# Patient Record
Sex: Male | Born: 1985 | Race: White | Hispanic: No | Marital: Married | Smoking: Former smoker
Health system: Southern US, Community
[De-identification: ages and names within clinical notes are randomized; demographics above are authoritative.]

## PROBLEM LIST (undated history)

## (undated) DIAGNOSIS — F319 Bipolar disorder, unspecified: Secondary | ICD-10-CM

---
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
## 2007-08-30 ENCOUNTER — Emergency Department: Payer: Self-pay | Admitting: Emergency Medicine

## 2007-09-07 ENCOUNTER — Emergency Department: Payer: Self-pay | Admitting: Emergency Medicine

## 2008-01-28 HISTORY — PX: SHOULDER SURGERY: SHX246

## 2008-07-28 ENCOUNTER — Emergency Department: Payer: Self-pay | Admitting: Emergency Medicine

## 2009-03-25 ENCOUNTER — Emergency Department (HOSPITAL_COMMUNITY): Admission: EM | Admit: 2009-03-25 | Discharge: 2009-03-25 | Payer: Self-pay | Admitting: Emergency Medicine

## 2009-03-26 ENCOUNTER — Emergency Department (HOSPITAL_COMMUNITY): Admission: EM | Admit: 2009-03-26 | Discharge: 2009-03-26 | Payer: Self-pay | Admitting: Emergency Medicine

## 2009-05-23 ENCOUNTER — Emergency Department: Payer: Self-pay | Admitting: Emergency Medicine

## 2010-03-11 ENCOUNTER — Emergency Department: Payer: Self-pay | Admitting: Emergency Medicine

## 2011-08-01 ENCOUNTER — Emergency Department: Payer: Self-pay | Admitting: Emergency Medicine

## 2011-08-11 ENCOUNTER — Ambulatory Visit: Payer: Self-pay | Admitting: General Practice

## 2012-03-21 ENCOUNTER — Emergency Department: Payer: Self-pay | Admitting: Emergency Medicine

## 2012-03-21 LAB — URINALYSIS, COMPLETE
Blood: NEGATIVE
Glucose,UR: NEGATIVE mg/dL (ref 0–75)
Leukocyte Esterase: NEGATIVE
Nitrite: NEGATIVE
Protein: NEGATIVE
WBC UR: 1 /HPF (ref 0–5)

## 2012-04-07 ENCOUNTER — Emergency Department: Payer: Self-pay | Admitting: Emergency Medicine

## 2012-12-15 ENCOUNTER — Emergency Department: Payer: Self-pay | Admitting: Emergency Medicine

## 2012-12-15 LAB — URINALYSIS, COMPLETE
Ph: 6 (ref 4.5–8.0)
RBC,UR: 5 /HPF (ref 0–5)
Squamous Epithelial: NONE SEEN

## 2012-12-15 LAB — BASIC METABOLIC PANEL
BUN: 9 mg/dL (ref 7–18)
Calcium, Total: 9.2 mg/dL (ref 8.5–10.1)
Co2: 31 mmol/L (ref 21–32)
Glucose: 96 mg/dL (ref 65–99)
Osmolality: 280 (ref 275–301)

## 2012-12-15 LAB — CBC
HCT: 45.6 % (ref 40.0–52.0)
Platelet: 202 10*3/uL (ref 150–440)

## 2013-04-27 ENCOUNTER — Emergency Department: Payer: Self-pay | Admitting: Emergency Medicine

## 2013-10-27 HISTORY — PX: OTHER SURGICAL HISTORY: SHX169

## 2013-12-24 ENCOUNTER — Emergency Department (HOSPITAL_COMMUNITY): Payer: Self-pay

## 2013-12-24 ENCOUNTER — Encounter (HOSPITAL_COMMUNITY): Payer: Self-pay | Admitting: Emergency Medicine

## 2013-12-24 ENCOUNTER — Emergency Department (HOSPITAL_COMMUNITY)
Admission: EM | Admit: 2013-12-24 | Discharge: 2013-12-24 | Disposition: A | Discharge status: Home / Self Care | Payer: Self-pay | Attending: Emergency Medicine | Admitting: Emergency Medicine

## 2013-12-24 DIAGNOSIS — Y92410 Unspecified street and highway as the place of occurrence of the external cause: Secondary | ICD-10-CM | POA: Insufficient documentation

## 2013-12-24 DIAGNOSIS — R42 Dizziness and giddiness: Secondary | ICD-10-CM | POA: Insufficient documentation

## 2013-12-24 DIAGNOSIS — S0990XA Unspecified injury of head, initial encounter: Secondary | ICD-10-CM | POA: Insufficient documentation

## 2013-12-24 DIAGNOSIS — Y9389 Activity, other specified: Secondary | ICD-10-CM | POA: Insufficient documentation

## 2013-12-24 DIAGNOSIS — S6391XA Sprain of unspecified part of right wrist and hand, initial encounter: Secondary | ICD-10-CM

## 2013-12-24 DIAGNOSIS — S6390XA Sprain of unspecified part of unspecified wrist and hand, initial encounter: Secondary | ICD-10-CM | POA: Insufficient documentation

## 2013-12-24 DIAGNOSIS — S199XXA Unspecified injury of neck, initial encounter: Secondary | ICD-10-CM

## 2013-12-24 DIAGNOSIS — S0993XA Unspecified injury of face, initial encounter: Secondary | ICD-10-CM | POA: Insufficient documentation

## 2013-12-24 MED ORDER — OXYCODONE-ACETAMINOPHEN 5-325 MG PO TABS: 1.0000 | ORAL_TABLET | ORAL | Status: DC | PRN

## 2013-12-24 MED ORDER — IBUPROFEN 600 MG PO TABS: 600.0000 mg | ORAL_TABLET | Freq: Four times a day (QID) | ORAL | Status: DC | PRN

## 2013-12-24 MED ORDER — OXYCODONE-ACETAMINOPHEN 5-325 MG PO TABS
1.0000 | ORAL_TABLET | Freq: Once | ORAL | Status: AC
  Administered 2013-12-24: 1 via ORAL
  Filled 2013-12-24: qty 1
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
## 2013-12-24 NOTE — Discharge Instructions (Signed)
Ibuprofen for pain. Percocet for severe pain. Follow up with hand specialist for further evaluation of your hand injury. Keep it elevated at home. Ice.   Sprain A sprain is a tear in one of the strong, fibrous tissues that connect your bones (ligaments). The severity of the sprain depends on how much of the ligament is torn. The tear can be either partial or complete. CAUSES  Often, sprains are a result of a fall or an injury. The force of the impact causes the fibers of your ligament to stretch beyond their normal length. This excess tension causes the fibers of your ligament to tear. SYMPTOMS  You may have some loss of motion or increased pain within your normal range of motion. Other symptoms include:  Bruising.  Tenderness.  Swelling. DIAGNOSIS  In order to diagnose a sprain, your caregiver will physically examine you to determine how torn the ligament is. Your caregiver may also suggest an X-ray exam to make sure no bones are broken. TREATMENT  If your ligament is only partially torn, treatment usually involves keeping the injured area in a fixed position (immobilization) for a short period. To do this, your caregiver will apply a bandage, cast, or splint to keep the area from moving until it heals. For a partially torn ligament, the healing process usually takes 2 to 3 weeks. If your ligament is completely torn, you may need surgery to reconnect the ligament to the bone or to reconstruct the ligament. After surgery, a cast or splint may be applied and will need to stay on for 4 to 6 weeks while your ligament heals. HOME CARE INSTRUCTIONS  Keep the injured area elevated to decrease swelling.  To ease pain and swelling, apply ice to your joint twice a day, for 2 to 3 days.  Put ice in a plastic bag.  Place a towel between your skin and the bag.  Leave the ice on for 15 minutes.  Only take over-the-counter or prescription medicine for pain as directed by your caregiver.  Do not  leave the injured area unprotected until pain and stiffness go away (usually 3 to 4 weeks).  Do not allow your cast or splint to get wet. Cover your cast or splint with a plastic bag when you shower or bathe. Do not swim.  Your caregiver may suggest exercises for you to do during your recovery to prevent or limit permanent stiffness. SEEK IMMEDIATE MEDICAL CARE IF:  Your cast or splint becomes damaged.  Your pain becomes worse. MAKE SURE YOU:  Understand these instructions.  Will watch your condition.  Will get help right away if you are not doing well or get worse. Document Released: 05/12/2000 Document Revised: 08/07/2011 Document Reviewed: 05/27/2011 [facility] Patient Information 2015 [place], [place]. This information is not intended to replace advice given to you by your health care provider. Make sure you discuss any questions you have with your health care provider.

## 2013-12-24 NOTE — Progress Notes (Signed)
Orthopedic Tech Progress Note Patient Details:  Gordy LevanKevin Sallie Jan 22, 1986 [id number]  Ortho Devices Type of Ortho Device: Ace wrap;Thumb spica splint Splint Material: Plaster Ortho Device/Splint Location: RUE Ortho Device/Splint Interventions: Ordered;Application   Jennye MoccasinHughes, Kona Lover Craig 12/24/2013, 7:48 PM
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
## 2013-12-25 NOTE — ED Provider Notes (Signed)
Medical screening examination/treatment/procedure(s) were performed by non-physician practitioner and as supervising physician I was immediately available for consultation/collaboration.  Donnika Kucher L Dare Spillman, MD 12/25/13 2340 
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
## 2014-10-31 ENCOUNTER — Emergency Department: Payer: Self-pay

## 2014-10-31 ENCOUNTER — Encounter: Payer: Self-pay | Admitting: Emergency Medicine

## 2014-10-31 ENCOUNTER — Emergency Department
Admission: EM | Admit: 2014-10-31 | Discharge: 2014-10-31 | Disposition: A | Discharge status: Home / Self Care | Payer: Self-pay | Attending: Emergency Medicine | Admitting: Emergency Medicine

## 2014-10-31 DIAGNOSIS — S20211A Contusion of right front wall of thorax, initial encounter: Secondary | ICD-10-CM | POA: Insufficient documentation

## 2014-10-31 DIAGNOSIS — Y9389 Activity, other specified: Secondary | ICD-10-CM | POA: Insufficient documentation

## 2014-10-31 DIAGNOSIS — Y9289 Other specified places as the place of occurrence of the external cause: Secondary | ICD-10-CM | POA: Insufficient documentation

## 2014-10-31 DIAGNOSIS — S4992XA Unspecified injury of left shoulder and upper arm, initial encounter: Secondary | ICD-10-CM | POA: Insufficient documentation

## 2014-10-31 DIAGNOSIS — Y998 Other external cause status: Secondary | ICD-10-CM | POA: Insufficient documentation

## 2014-10-31 DIAGNOSIS — Z87891 Personal history of nicotine dependence: Secondary | ICD-10-CM | POA: Insufficient documentation

## 2014-10-31 DIAGNOSIS — S46911A Strain of unspecified muscle, fascia and tendon at shoulder and upper arm level, right arm, initial encounter: Secondary | ICD-10-CM | POA: Insufficient documentation

## 2014-10-31 MED ORDER — KETOROLAC TROMETHAMINE 60 MG/2ML IM SOLN
INTRAMUSCULAR | Status: AC
  Administered 2014-10-31: 60 mg via INTRAMUSCULAR
  Filled 2014-10-31: qty 2

## 2014-10-31 MED ORDER — ORPHENADRINE CITRATE 30 MG/ML IJ SOLN
INTRAMUSCULAR | Status: AC
  Administered 2014-10-31: 60 mg via INTRAMUSCULAR
  Filled 2014-10-31: qty 2

## 2014-10-31 MED ORDER — KETOROLAC TROMETHAMINE 60 MG/2ML IM SOLN
60.0000 mg | Freq: Once | INTRAMUSCULAR | Status: AC
  Administered 2014-10-31: 60 mg via INTRAMUSCULAR

## 2014-10-31 MED ORDER — ORPHENADRINE CITRATE 30 MG/ML IJ SOLN
60.0000 mg | INTRAMUSCULAR | Status: AC
  Administered 2014-10-31: 60 mg via INTRAMUSCULAR

## 2014-10-31 MED ORDER — KETOROLAC TROMETHAMINE 10 MG PO TABS: 10.0000 mg | ORAL_TABLET | Freq: Three times a day (TID) | ORAL | Status: DC

## 2014-10-31 MED ORDER — CYCLOBENZAPRINE HCL 5 MG PO TABS: 5.0000 mg | ORAL_TABLET | Freq: Three times a day (TID) | ORAL | Status: AC | PRN

## 2014-10-31 MED ORDER — HYDROCODONE-ACETAMINOPHEN 5-325 MG PO TABS: 1.0000 | ORAL_TABLET | ORAL | Status: AC | PRN
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
## 2015-02-12 ENCOUNTER — Ambulatory Visit
Admission: RE | Admit: 2015-02-12 | Discharge: 2015-02-12 | Disposition: A | Discharge status: Home / Self Care | Payer: Medicaid Other | Source: Ambulatory Visit | Attending: Internal Medicine | Admitting: Internal Medicine

## 2015-02-12 ENCOUNTER — Emergency Department
Admission: EM | Admit: 2015-02-12 | Discharge: 2015-02-12 | Disposition: A | Discharge status: Home / Self Care | Payer: Medicaid Other | Attending: Emergency Medicine | Admitting: Emergency Medicine

## 2015-02-12 ENCOUNTER — Other Ambulatory Visit: Payer: Self-pay | Admitting: Internal Medicine

## 2015-02-12 DIAGNOSIS — Y998 Other external cause status: Secondary | ICD-10-CM | POA: Insufficient documentation

## 2015-02-12 DIAGNOSIS — Y9389 Activity, other specified: Secondary | ICD-10-CM | POA: Diagnosis not present

## 2015-02-12 DIAGNOSIS — Z87891 Personal history of nicotine dependence: Secondary | ICD-10-CM | POA: Insufficient documentation

## 2015-02-12 DIAGNOSIS — M25512 Pain in left shoulder: Secondary | ICD-10-CM | POA: Insufficient documentation

## 2015-02-12 DIAGNOSIS — M24412 Recurrent dislocation, left shoulder: Secondary | ICD-10-CM

## 2015-02-12 DIAGNOSIS — S4992XA Unspecified injury of left shoulder and upper arm, initial encounter: Secondary | ICD-10-CM | POA: Diagnosis present

## 2015-02-12 DIAGNOSIS — Y92410 Unspecified street and highway as the place of occurrence of the external cause: Secondary | ICD-10-CM | POA: Insufficient documentation

## 2015-02-12 DIAGNOSIS — Z87828 Personal history of other (healed) physical injury and trauma: Secondary | ICD-10-CM | POA: Diagnosis not present

## 2015-02-12 MED ORDER — MELOXICAM 15 MG PO TABS
15.0000 mg | ORAL_TABLET | Freq: Every day | ORAL | Status: AC
Start: 2015-02-12 — End: ?

## 2015-02-12 MED ORDER — OXYCODONE-ACETAMINOPHEN 5-325 MG PO TABS: 1.0000 | ORAL_TABLET | Freq: Four times a day (QID) | ORAL | Status: AC | PRN
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
## 2015-02-12 NOTE — Discharge Instructions (Signed)
Shoulder Dislocation  Your shoulder is made up of three bones: the collar bone (clavicle); the shoulder blade (scapula), which includes the socket (glenoid cavity); and the upper arm bone (humerus). Your shoulder joint is the place where these bones meet. Strong, fibrous tissues hold these bones together (ligaments). Muscles and strong, fibrous tissues that connect the muscles to these bones (tendons) allow your arm to move through this joint. The range of motion of your shoulder joint is more extensive than most of your other joints, and the glenoid cavity is very shallow. That is the reason that your shoulder joint is one of the most unstable joints in your body. It is far more prone to dislocation than your other joints. Shoulder dislocation is when your humerus is forced out of your shoulder joint.  CAUSES  Shoulder dislocation is caused by a forceful impact on your shoulder. This impact usually is from an injury, such as a sports injury or a fall.  SYMPTOMS  Symptoms of shoulder dislocation include:  · Deformity of your shoulder.  · Intense pain.  · Inability to move your shoulder joint.  · Numbness, weakness, or tingling around your shoulder joint (your neck or down your arm).  · Bruising or swelling around your shoulder.  DIAGNOSIS  In order to diagnose a dislocated shoulder, your caregiver will perform a physical exam. Your caregiver also may have an X-ray exam done to see if you have any broken bones. Magnetic resonance imaging (MRI) is a procedure that sometimes is done to help your caregiver see any damage to the soft tissues around your shoulder, particularly your rotator cuff tendons. Additionally, your caregiver also may have electromyography done to measure the electrical discharges produced in your muscles if you have signs or symptoms of nerve damage.  TREATMENT  A shoulder dislocation is treated by placing the humerus back in the joint (reduction). Your caregiver does this either manually (closed  reduction), by moving your humerus back into the joint through manipulation, or through surgery (open reduction). When your humerus is back in place, severe pain should improve almost immediately.  You also may need to have surgery if you have a weak shoulder joint or ligaments, and you have recurring shoulder dislocations, despite rehabilitation. In rare cases, surgery is necessary if your nerves or blood vessels are damaged during the dislocation.  After your reduction, your arm will be placed in a shoulder immobilizer or sling to keep it from moving. Your caregiver will have you wear your shoulder immobilizer or sling for 3 days to 3 weeks, depending on how serious your dislocation is. When your shoulder immobilizer or sling is removed, your caregiver may prescribe physical therapy to help improve the range of motion in your shoulder joint.  HOME CARE INSTRUCTIONS   The following measures can help to reduce pain and speed up the healing process:  · Rest your injured joint. Do not move it. Avoid activities similar to the one that caused your injury.  · Apply ice to your injured joint for the first day or two after your reduction or as directed by your caregiver. Applying ice helps to reduce inflammation and pain.  ¨ Put ice in a plastic bag.  ¨ Place a towel between your skin and the bag.  ¨ Leave the ice on for 15-20 minutes at a time, every 2 hours while you are awake.  · Exercise your hand by squeezing a soft ball. This helps to eliminate stiffness and swelling in your hand and   wrist.  · Take over-the-counter or prescription medicine for pain or discomfort as told by your caregiver.  SEEK IMMEDIATE MEDICAL CARE IF:   · Your shoulder immobilizer or sling becomes damaged.  · Your pain becomes worse rather than better.  · You lose feeling in your arm or hand, or they become white and cold.  MAKE SURE YOU:   · Understand these instructions.  · Will watch your condition.  · Will get help right away if you are not  doing well or get worse.  Document Released: 02/07/2001 Document Revised: 09/29/2013 Document Reviewed: 03/05/2011  [employer]® Patient Information ©2015 [employer]. This information is not intended to replace advice given to you by your health care provider. Make sure you discuss any questions you have with your health care provider.
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
## 2015-02-24 ENCOUNTER — Emergency Department (HOSPITAL_COMMUNITY): Payer: Medicaid Other

## 2015-02-24 ENCOUNTER — Encounter (HOSPITAL_COMMUNITY): Payer: Self-pay | Admitting: Emergency Medicine

## 2015-02-24 ENCOUNTER — Emergency Department (HOSPITAL_COMMUNITY)
Admission: EM | Admit: 2015-02-24 | Discharge: 2015-02-24 | Disposition: A | Discharge status: Home / Self Care | Payer: Medicaid Other | Attending: Emergency Medicine | Admitting: Emergency Medicine

## 2015-02-24 DIAGNOSIS — Z791 Long term (current) use of non-steroidal anti-inflammatories (NSAID): Secondary | ICD-10-CM | POA: Insufficient documentation

## 2015-02-24 DIAGNOSIS — S4991XA Unspecified injury of right shoulder and upper arm, initial encounter: Secondary | ICD-10-CM | POA: Diagnosis present

## 2015-02-24 DIAGNOSIS — S40212A Abrasion of left shoulder, initial encounter: Secondary | ICD-10-CM | POA: Insufficient documentation

## 2015-02-24 DIAGNOSIS — Z8659 Personal history of other mental and behavioral disorders: Secondary | ICD-10-CM | POA: Insufficient documentation

## 2015-02-24 DIAGNOSIS — Y92410 Unspecified street and highway as the place of occurrence of the external cause: Secondary | ICD-10-CM | POA: Insufficient documentation

## 2015-02-24 DIAGNOSIS — Y998 Other external cause status: Secondary | ICD-10-CM | POA: Insufficient documentation

## 2015-02-24 DIAGNOSIS — S8992XA Unspecified injury of left lower leg, initial encounter: Secondary | ICD-10-CM | POA: Insufficient documentation

## 2015-02-24 DIAGNOSIS — Y9389 Activity, other specified: Secondary | ICD-10-CM | POA: Diagnosis not present

## 2015-02-24 DIAGNOSIS — M79601 Pain in right arm: Secondary | ICD-10-CM

## 2015-02-24 DIAGNOSIS — Z87891 Personal history of nicotine dependence: Secondary | ICD-10-CM | POA: Insufficient documentation

## 2015-02-24 HISTORY — DX: Bipolar disorder, unspecified: F31.9

## 2015-02-24 MED ORDER — SODIUM CHLORIDE 0.9 % IV BOLUS (SEPSIS)
1000.0000 mL | Freq: Once | INTRAVENOUS | Status: AC
  Administered 2015-02-24: 1000 mL via INTRAVENOUS

## 2015-02-24 MED ORDER — KETOROLAC TROMETHAMINE 30 MG/ML IJ SOLN
30.0000 mg | Freq: Once | INTRAMUSCULAR | Status: AC
  Administered 2015-02-24: 30 mg via INTRAVENOUS
  Filled 2015-02-24: qty 1

## 2015-02-24 MED ORDER — ONDANSETRON HCL 4 MG/2ML IJ SOLN
4.0000 mg | Freq: Once | INTRAMUSCULAR | Status: AC
  Administered 2015-02-24: 4 mg via INTRAVENOUS
  Filled 2015-02-24: qty 2

## 2015-02-24 MED ORDER — HYDROMORPHONE HCL 1 MG/ML IJ SOLN
1.0000 mg | Freq: Once | INTRAMUSCULAR | Status: AC
  Administered 2015-02-24: 1 mg via INTRAVENOUS
  Filled 2015-02-24: qty 1
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
## 2015-02-24 NOTE — Discharge Instructions (Signed)

## 2015-04-30 ENCOUNTER — Emergency Department
Admission: EM | Admit: 2015-04-30 | Discharge: 2015-04-30 | Disposition: A | Discharge status: Home / Self Care | Payer: Medicaid Other | Attending: Emergency Medicine | Admitting: Emergency Medicine

## 2015-04-30 ENCOUNTER — Encounter: Payer: Self-pay | Admitting: Medical Oncology

## 2015-04-30 DIAGNOSIS — L03211 Cellulitis of face: Secondary | ICD-10-CM | POA: Diagnosis not present

## 2015-04-30 DIAGNOSIS — L089 Local infection of the skin and subcutaneous tissue, unspecified: Secondary | ICD-10-CM | POA: Diagnosis present

## 2015-04-30 DIAGNOSIS — Z87891 Personal history of nicotine dependence: Secondary | ICD-10-CM | POA: Diagnosis not present

## 2015-04-30 DIAGNOSIS — Z791 Long term (current) use of non-steroidal anti-inflammatories (NSAID): Secondary | ICD-10-CM | POA: Insufficient documentation

## 2015-04-30 MED ORDER — SULFAMETHOXAZOLE-TRIMETHOPRIM 800-160 MG PO TABS: 1.0000 | ORAL_TABLET | Freq: Two times a day (BID) | ORAL | Status: AC

## 2015-04-30 MED ORDER — CEFTRIAXONE SODIUM 250 MG IJ SOLR
250.0000 mg | Freq: Once | INTRAMUSCULAR | Status: AC
  Administered 2015-04-30: 250 mg via INTRAMUSCULAR
  Filled 2015-04-30: qty 250

## 2015-04-30 NOTE — ED Provider Notes (Signed)
[facility] Emergency Department Provider Note  ____________________________________________  Time seen: Approximately 3:32 PM  I have reviewed the triage vital signs and the nursing notes.   HISTORY  Chief Complaint Wound Infection    HPI Mario Drake is a 29 y.o. male presents to the emergency department for a complaint of "infectedpiercings/implants." He states that he had these placed 4-5 weeks ago is now experiencing increased redness, swelling, and minimal purulent drainage. He reports that the pain has increased over the 24 hours. He endorses a headache with mild photophobia to right eye. Denies any visual acuity changes. He denies any fevers or chills. Denies any neck pain. Denies any chest pain or shortness of breath. Pain is described as sharp/burning, constant, unrelieved by over-the-counter medications.   Past Medical History  Diagnosis Date  . Bipolar 1 disorder (HCC)     There are no active problems to display for this patient.   Past Surgical History  Procedure Laterality Date  . Shoulder surgery Left 08/2006  . Shoulder surgery Left 01/2008  . Thumb surgery Right 10/2013    Current Outpatient Rx  Name  Route  Sig  Dispense  Refill  . cyclobenzaprine (FLEXERIL) 5 MG tablet   Oral   Take 1 tablet (5 mg total) by mouth every 8 (eight) hours as needed for muscle spasms. Patient not taking: Reported on 02/24/2015   12 tablet   0   . HYDROcodone-acetaminophen (NORCO) 5-325 MG per tablet   Oral   Take 1 tablet by mouth every 4 (four) hours as needed for moderate pain. Patient not taking: Reported on 02/24/2015   6 tablet   0   . ketorolac (TORADOL) 10 MG tablet   Oral   Take 1 tablet (10 mg total) by mouth every 8 (eight) hours.   15 tablet   0   . meloxicam (MOBIC) 15 MG tablet   Oral   Take 1 tablet (15 mg total) by mouth daily.   30 tablet   0   . oxyCODONE-acetaminophen (ROXICET) 5-325 MG per tablet   Oral   Take 1  tablet by mouth every 6 (six) hours as needed. Patient not taking: Reported on 02/24/2015   20 tablet   0   . sulfamethoxazole-trimethoprim (BACTRIM DS,SEPTRA DS) 800-160 MG tablet   Oral   Take 1 tablet by mouth 2 (two) times daily.   14 tablet   0     Allergies Valium  No family history on file.  Social History Social History  Substance Use Topics  . Smoking status: Former Smoker    Quit date: 11/24/2014  . Smokeless tobacco: Current User  . Alcohol Use: 0.6 oz/week    1 Standard drinks or equivalent per week    Review of Systems Constitutional: No fever/chills Eyes: No visual changes. ENT: No sore throat. Cardiovascular: Denies chest pain. Respiratory: Denies shortness of breath. Gastrointestinal: No abdominal pain.  No nausea, no vomiting.  No diarrhea.  No constipation. Genitourinary: Negative for dysuria. Musculoskeletal: Negative for back pain. Skin: Negative for rash. Endorses infection around facial piercings/implant Neurological: Negative for headaches, focal weakness or numbness.  10-point ROS otherwise negative.  ____________________________________________   PHYSICAL EXAM:  VITAL SIGNS: ED Triage Vitals  Enc Vitals Group     BP 04/30/15 1435 120/81 mmHg     Pulse Rate 04/30/15 1435 62     Resp 04/30/15 1435 18     Temp 04/30/15 1435 97.8 F (36.6 C)  Temp Source 04/30/15 1435 Oral     SpO2 04/30/15 1435 97 %     Weight 04/30/15 1435 220 lb (99.791 kg)     Height --      Head Cir --      Peak Flow --      Pain Score 04/30/15 1436 7     Pain Loc --      Pain Edu? --      Excl. in GC? --     Constitutional: Alert and oriented. Well appearing and in no acute distress. Eyes: Conjunctivae are normal. PERRL. EOMI. Head: Atraumatic. Nose: No congestion/rhinnorhea. Mouth/Throat: Mucous membranes are moist.  Oropharynx non-erythematous. Neck: No stridor.   Hematological/Lymphatic/Immunilogical: No cervical lymphadenopathy. Cardiovascular:  Normal rate, regular rhythm. Grossly normal heart sounds.  Good peripheral circulation. Respiratory: Normal respiratory effort.  No retractions. Lungs CTAB. Gastrointestinal: Soft and nontender. No distention. No abdominal bruits. No CVA tenderness. Musculoskeletal: No lower extremity tenderness nor edema.  No joint effusions. Neurologic:  Normal speech and language. No gross focal neurologic deficits are appreciated. No gait instability. Skin:  Skin is warm, dry and intact. No rash noted. For micro piercings/implant are appreciated to the lateral aspect of right orbit. Around each piercing/and plan there is erythema and edema noted. 1 implant has minor purulent crusting around. No fluctuance to the area. There is mild firmness to the area. Psychiatric: Mood and affect are normal. Speech and behavior are normal.  ____________________________________________   LABS (all labs ordered are listed, but only abnormal results are displayed)  Labs Reviewed - No data to display ____________________________________________  EKG   ____________________________________________  RADIOLOGY   ____________________________________________   PROCEDURES  Procedure(s) performed: None  Critical Care performed: No  ____________________________________________   INITIAL IMPRESSION / ASSESSMENT AND PLAN / ED COURSE  Pertinent labs & imaging results that were available during my care of the patient were reviewed by me and considered in my medical decision making (see chart for details).  Patient's history, symptoms, physical exam are consistent with infected nursing/implants to the right lateral orbital rim. There is no underlying fluctuance at this time. Patient received 250 mg IM Rocephin here in the emergency department and was placed on Bactrim. I advised patient to follow-up with ENT provider for follow-up for possible removal. Patient verbalizes understanding of diagnosis and treatment plan and  verbalizes compliance of same. ____________________________________________   FINAL CLINICAL IMPRESSION(S) / ED DIAGNOSES  Final diagnoses:  Cellulitis of face      Racheal PatchesJonathan D Moya Duan, PA-C 04/30/15 1551  Emily FilbertJonathan E Williams, MD 05/03/15 [phone]

## 2015-04-30 NOTE — ED Notes (Signed)
Pt c/o severe pain to right arm after injection. Warm compress applied. No redness or swelling noted.  johnathan pa at bedside to assess.

## 2015-04-30 NOTE — Discharge Instructions (Signed)

## 2015-04-30 NOTE — ED Notes (Signed)
Pt reports that he has 4 dermal piercings to rt side of face that have become painful and making his vision blurry in the rt eye.

## 2015-04-30 NOTE — ED Notes (Signed)
Pt much calmer and arm pain improving. Remains with heat to arm.

## 2015-06-16 ENCOUNTER — Encounter: Payer: Self-pay | Admitting: *Deleted

## 2015-06-23 NOTE — Discharge Instructions (Signed)

## 2015-06-24 ENCOUNTER — Ambulatory Visit: Payer: Medicaid Other | Admitting: Anesthesiology

## 2015-06-24 ENCOUNTER — Ambulatory Visit
Admission: RE | Admit: 2015-06-24 | Discharge: 2015-06-24 | Disposition: A | Discharge status: Home / Self Care | Payer: Medicaid Other | Source: Ambulatory Visit | Attending: Otolaryngology | Admitting: Otolaryngology

## 2015-06-24 ENCOUNTER — Encounter
Admission: RE | Disposition: A | Discharge status: Home / Self Care | Payer: Medicaid Other | Source: Ambulatory Visit | Attending: Otolaryngology

## 2015-06-24 DIAGNOSIS — Y939 Activity, unspecified: Secondary | ICD-10-CM | POA: Insufficient documentation

## 2015-06-24 DIAGNOSIS — Z87891 Personal history of nicotine dependence: Secondary | ICD-10-CM | POA: Insufficient documentation

## 2015-06-24 DIAGNOSIS — X58XXXA Exposure to other specified factors, initial encounter: Secondary | ICD-10-CM | POA: Diagnosis not present

## 2015-06-24 DIAGNOSIS — L723 Sebaceous cyst: Secondary | ICD-10-CM | POA: Diagnosis not present

## 2015-06-24 DIAGNOSIS — S0085XA Superficial foreign body of other part of head, initial encounter: Secondary | ICD-10-CM | POA: Diagnosis present

## 2015-06-24 HISTORY — PX: MASS EXCISION: SHX2000

## 2015-06-24 SURGERY — EXCISION MASS
Anesthesia: Monitor Anesthesia Care | Site: Face | Laterality: Right | Wound class: Clean

## 2015-06-24 MED ORDER — BACITRACIN 500 UNIT/GM EX OINT
TOPICAL_OINTMENT | CUTANEOUS | Status: DC | PRN
  Administered 2015-06-24: 1 via TOPICAL

## 2015-06-24 MED ORDER — LIDOCAINE-EPINEPHRINE 1 %-1:100000 IJ SOLN
INTRAMUSCULAR | Status: DC | PRN
  Administered 2015-06-24: 3 mL

## 2015-06-24 MED ORDER — FENTANYL CITRATE (PF) 100 MCG/2ML IJ SOLN: 25.0000 ug | INTRAMUSCULAR | Status: DC | PRN

## 2015-06-24 MED ORDER — LACTATED RINGERS IV SOLN
INTRAVENOUS | Status: DC
  Administered 2015-06-24: 11:00:00 via INTRAVENOUS

## 2015-06-24 MED ORDER — FENTANYL CITRATE (PF) 100 MCG/2ML IJ SOLN
INTRAMUSCULAR | Status: DC | PRN
  Administered 2015-06-24: 100 ug via INTRAVENOUS

## 2015-06-24 MED ORDER — ONDANSETRON HCL 4 MG/2ML IJ SOLN: 4.0000 mg | Freq: Once | INTRAMUSCULAR | Status: DC | PRN

## 2015-06-24 MED ORDER — PROPOFOL 500 MG/50ML IV EMUL
INTRAVENOUS | Status: DC | PRN
  Administered 2015-06-24: 100 ug/kg/min via INTRAVENOUS

## 2015-06-24 MED ORDER — MIDAZOLAM HCL 2 MG/2ML IJ SOLN
INTRAMUSCULAR | Status: DC | PRN
  Administered 2015-06-24: 2 mg via INTRAVENOUS

## 2015-06-24 MED ORDER — LIDOCAINE HCL (CARDIAC) 20 MG/ML IV SOLN
INTRAVENOUS | Status: DC | PRN
  Administered 2015-06-24: 50 mg via INTRAVENOUS

## 2015-06-24 MED ORDER — OXYCODONE HCL 5 MG PO TABS
5.0000 mg | ORAL_TABLET | Freq: Once | ORAL | Status: AC
  Administered 2015-06-24: 5 mg via ORAL

## 2015-06-24 MED ORDER — ONDANSETRON HCL 4 MG/2ML IJ SOLN
INTRAMUSCULAR | Status: DC | PRN
  Administered 2015-06-24: 4 mg via INTRAVENOUS

## 2015-06-24 SURGICAL SUPPLY — 24 items
BANDAGE ADH SHEER 1  50/CT (GAUZE/BANDAGES/DRESSINGS) ×6 IMPLANT
BLADE SURG SZ11 CARB STEEL (BLADE) ×3 IMPLANT
CNTNR SPEC 2.5X3XGRAD LEK (MISCELLANEOUS) ×1
CONT SPEC 4OZ STER OR WHT (MISCELLANEOUS) ×2
CONTAINER SPEC 2.5X3XGRAD LEK (MISCELLANEOUS) ×1 IMPLANT
CORD BIP STRL DISP 12FT (MISCELLANEOUS) ×3 IMPLANT
DRAPE HEAD BAR (DRAPES) ×3 IMPLANT
DRESSING TELFA 4X3 1S ST N-ADH (GAUZE/BANDAGES/DRESSINGS) IMPLANT
GAUZE SPONGE 4X4 12PLY STRL (GAUZE/BANDAGES/DRESSINGS) IMPLANT
GLOVE PI ULTRA LF STRL 7.5 (GLOVE) ×2 IMPLANT
GLOVE PI ULTRA NON LATEX 7.5 (GLOVE) ×4
KIT ROOM TURNOVER OR (KITS) ×3 IMPLANT
LIQUID BAND (GAUZE/BANDAGES/DRESSINGS) IMPLANT
NEEDLE HYPO 25GX1X1/2 BEV (NEEDLE) ×3 IMPLANT
NS IRRIG 500ML POUR BTL (IV SOLUTION) ×3 IMPLANT
PACK DRAPE NASAL/ENT (PACKS) ×3 IMPLANT
SOL PREP PVP 2OZ (MISCELLANEOUS) ×3
SOLUTION PREP PVP 2OZ (MISCELLANEOUS) ×1 IMPLANT
STRAP BODY AND KNEE 60X3 (MISCELLANEOUS) ×6 IMPLANT
SUT PROLENE 5 0 P 3 (SUTURE) ×3 IMPLANT
SUT PROLENE 6 0 P 1 18 (SUTURE) ×3 IMPLANT
SUT VIC AB 4-0 RB1 27 (SUTURE)
SUT VIC AB 4-0 RB1 27X BRD (SUTURE) IMPLANT
SYRINGE 10CC LL (SYRINGE) ×3 IMPLANT

## 2015-06-24 NOTE — Transfer of Care (Signed)
Immediate Anesthesia Transfer of Care Note  Patient: Mario Drake  Procedure(s) Performed: Procedure(s): EXCISION OF FOREIGN BODIES FROM RIGHT TEMPLE AREA (Right)  Patient Location: PACU  Anesthesia Type: MAC  Level of Consciousness: awake, alert  and patient cooperative  Airway and Oxygen Therapy: Patient Spontanous Breathing and Patient connected to supplemental oxygen  Post-op Assessment: Post-op Vital signs reviewed, Patient's Cardiovascular Status Stable, Respiratory Function Stable, Patent Airway and No signs of Nausea or vomiting  Post-op Vital Signs: Reviewed and stable  Complications: No apparent anesthesia complications

## 2015-06-24 NOTE — Anesthesia Preprocedure Evaluation (Signed)
Anesthesia Evaluation  Patient identified by MRN, date of birth, ID band  Reviewed: Allergy & Precautions, H&P , NPO status , Patient's Chart, lab work & pertinent test results  Airway Mallampati: II  TM Distance: >3 FB Neck ROM: full    Dental no notable dental hx.    Pulmonary former smoker,    Pulmonary exam normal        Cardiovascular  Rhythm:regular Rate:Normal     Neuro/Psych PSYCHIATRIC DISORDERS    GI/Hepatic   Endo/Other    Renal/GU      Musculoskeletal   Abdominal   Peds  Hematology   Anesthesia Other Findings   Reproductive/Obstetrics                             Anesthesia Physical Anesthesia Plan  ASA: II  Anesthesia Plan: MAC   Post-op Pain Management:    Induction:   Airway Management Planned:   Additional Equipment:   Intra-op Plan:   Post-operative Plan:   Informed Consent: I have reviewed the patients History and Physical, chart, labs and discussed the procedure including the risks, benefits and alternatives for the proposed anesthesia with the patient or authorized representative who has indicated his/her understanding and acceptance.     Plan Discussed with: CRNA  Anesthesia Plan Comments:         Anesthesia Quick Evaluation  

## 2015-06-24 NOTE — Anesthesia Postprocedure Evaluation (Signed)
Anesthesia Post Note  [name]  Procedure(s) Performed: Procedure(s) (LRB): EXCISION OF FOREIGN BODIES FROM RIGHT TEMPLE AREA (Right)  Patient location during evaluation: PACU Anesthesia Type: MAC Level of consciousness: awake and alert and oriented Pain management: satisfactory to patient Vital Signs Assessment: post-procedure vital signs reviewed and stable Respiratory status: spontaneous breathing, nonlabored ventilation and respiratory function stable Cardiovascular status: blood pressure returned to baseline and stable Postop Assessment: Adequate PO intake and No signs of nausea or vomiting Anesthetic complications: no    Cherly Beach

## 2015-06-24 NOTE — Anesthesia Procedure Notes (Signed)
Procedure Name: MAC Performed by: Seth Friedlander Pre-anesthesia Checklist: Patient identified, Emergency Drugs available, Suction available, Timeout performed and Patient being monitored Patient Re-evaluated:Patient Re-evaluated prior to inductionOxygen Delivery Method: Nasal cannula Placement Confirmation: positive ETCO2       

## 2015-06-24 NOTE — Op Note (Signed)
06/24/2015  1:09 PM    Mario Drake  [id number]   Pre-Op Dx:  Foreign body of skin right temple area 4  Post-op Dx: Same  Proc: Removal of foreign body right temporal area 4   Surg:  [name]  Anes:  GOT  EBL:  Minimal  Comp:  None  Findings:  The patient had implanted foreign bodies into the skin of the temple area in 4 separate areas. He's had been infected in our scarred down. There appeared to be a small sebaceous cyst at the most inferior medial foreign body that was expressed when the piece of metal was removed  Procedure: The patient was given IV sedation placed in a supine position on the operating room table. His head was turned slightly to the left side. You could see the foreign bodies sticking out through the skin in a semicircular fashion from his right temple and around to his right upper cheek. Half of milliliter of Xylocaine 1% with epi 1:100,000 was used for infiltration of the skin at each foreign body. He was prepped and draped sterile fashion.  The piece of metal the could be seen was grasped and pulled out to be able to visualize where the deeper buried portion of the foreign body was. Sinal incision was created to go through skin and down subcutaneous to separate scar tissue from the foreign body. The 4 mm incision was created to get it to finally popout. Leading was controlled with direct pressure. This was repeated 3 more times for removal of each foreign body. The most inferior medial 1 had some thick white cheeselike material that came out around it that appeared to be from a sebaceous cyst, created by the foreign body.  Each wound was closed using a 6-0 Prolene with an interrupted suture to close the skin. The patient tolerated the procedure well. He was awakened and taken to the recovery room in satisfactory condition. There were no operative complications.  Dispo:   To PACU to be discharged home.  Plan:  To follow-up in the office in 5 days for  suture removal. He'll keep Band-Aid overgrown to protect the skin. He'll remain on his antibiotics and all if he has any problems prior to his return visit. The foreign bodies were given to the family to take home.  [name]  06/24/2015 1:09 PM

## 2015-06-24 NOTE — H&P (Signed)
  H&P has been reviewed and no changes necessary. To be downloaded later. 

## 2015-06-25 ENCOUNTER — Encounter: Payer: Self-pay | Admitting: Otolaryngology

## 2015-10-22 ENCOUNTER — Emergency Department
Admission: EM | Admit: 2015-10-22 | Discharge: 2015-10-22 | Disposition: A | Discharge status: Home / Self Care | Payer: Medicaid Other | Attending: Emergency Medicine | Admitting: Emergency Medicine

## 2015-10-22 ENCOUNTER — Emergency Department: Payer: Medicaid Other

## 2015-10-22 DIAGNOSIS — Y999 Unspecified external cause status: Secondary | ICD-10-CM | POA: Insufficient documentation

## 2015-10-22 DIAGNOSIS — S60221A Contusion of right hand, initial encounter: Secondary | ICD-10-CM | POA: Diagnosis not present

## 2015-10-22 DIAGNOSIS — Z87891 Personal history of nicotine dependence: Secondary | ICD-10-CM | POA: Diagnosis not present

## 2015-10-22 DIAGNOSIS — F319 Bipolar disorder, unspecified: Secondary | ICD-10-CM | POA: Diagnosis not present

## 2015-10-22 DIAGNOSIS — W208XXA Other cause of strike by thrown, projected or falling object, initial encounter: Secondary | ICD-10-CM | POA: Diagnosis not present

## 2015-10-22 DIAGNOSIS — Y9389 Activity, other specified: Secondary | ICD-10-CM | POA: Diagnosis not present

## 2015-10-22 DIAGNOSIS — M79641 Pain in right hand: Secondary | ICD-10-CM | POA: Diagnosis present

## 2015-10-22 DIAGNOSIS — Y929 Unspecified place or not applicable: Secondary | ICD-10-CM | POA: Insufficient documentation

## 2015-10-22 MED ORDER — MORPHINE SULFATE (PF) 4 MG/ML IV SOLN
4.0000 mg | Freq: Once | INTRAVENOUS | Status: AC
  Administered 2015-10-22: 4 mg via INTRAVENOUS
  Filled 2015-10-22: qty 1

## 2015-10-22 MED ORDER — ONDANSETRON HCL 4 MG/2ML IJ SOLN
4.0000 mg | Freq: Once | INTRAMUSCULAR | Status: AC
  Administered 2015-10-22: 4 mg via INTRAVENOUS
  Filled 2015-10-22: qty 2

## 2015-10-22 MED ORDER — KETOROLAC TROMETHAMINE 10 MG PO TABS: 10.0000 mg | ORAL_TABLET | Freq: Three times a day (TID) | ORAL | Status: AC | PRN

## 2015-10-22 NOTE — ED Provider Notes (Signed)
[facility] Emergency Department Provider Note  ____________________________________________  Time seen: 3:40 AM  I have reviewed the triage vital signs and the nursing notes.   HISTORY  Chief Complaint Hand Pain     HPI Mario Drake is a 30 y.o. male presents with right hand swelling and pain status post having a 40 pound weight lifting plate accidentally falling on his hand. Patient admits 10 out of 10 pain in his right hand/wrist at this time.    Past Medical History  Diagnosis Date  . Bipolar 1 disorder (HCC)     There are no active problems to display for this patient.   Past Surgical History  Procedure Laterality Date  . Shoulder surgery Left 08/2006  . Shoulder surgery Left 01/2008  . Thumb surgery Right 10/2013  . Mass excision Right 06/24/2015    Procedure: EXCISION OF FOREIGN BODIES FROM RIGHT TEMPLE AREA;  Surgeon: Vernie MurdersPaul Juengel, MD;  [facility];  Service: ENT;  Laterality: Right;    Current Outpatient Rx  Name  Route  Sig  Dispense  Refill  . cyclobenzaprine (FLEXERIL) 5 MG tablet   Oral   Take 1 tablet (5 mg total) by mouth every 8 (eight) hours as needed for muscle spasms. Patient not taking: Reported on 02/24/2015   12 tablet   0   . HYDROcodone-acetaminophen (NORCO) 5-325 MG per tablet   Oral   Take 1 tablet by mouth every 4 (four) hours as needed for moderate pain. Patient not taking: Reported on 02/24/2015   6 tablet   0   . ketorolac (TORADOL) 10 MG tablet   Oral   Take 1 tablet (10 mg total) by mouth every 8 (eight) hours. Patient not taking: Reported on 06/16/2015   15 tablet   0   . meloxicam (MOBIC) 15 MG tablet   Oral   Take 1 tablet (15 mg total) by mouth daily. Patient not taking: Reported on 06/16/2015   30 tablet   0   . oxyCODONE-acetaminophen (ROXICET) 5-325 MG per tablet   Oral   Take 1 tablet by mouth every 6 (six) hours as needed. Patient not taking: Reported on 02/24/2015   20  tablet   0   . sulfamethoxazole-trimethoprim (BACTRIM DS,SEPTRA DS) 800-160 MG tablet   Oral   Take 1 tablet by mouth 2 (two) times daily. Patient not taking: Reported on 06/16/2015   14 tablet   0     Allergies Valium  No family history on file.  Social History Social History  Substance Use Topics  . Smoking status: Former Smoker    Quit date: 11/24/2014  . Smokeless tobacco: Current User  . Alcohol Use: No    Review of Systems  Constitutional: Negative for fever. Eyes: Negative for visual changes. ENT: Negative for sore throat. Cardiovascular: Negative for chest pain. Respiratory: Negative for shortness of breath. Gastrointestinal: Negative for abdominal pain, vomiting and diarrhea. Genitourinary: Negative for dysuria. Musculoskeletal: Negative for back pain.Positive for right hand pain swelling Skin: Negative for rash. Neurological: Negative for headaches, focal weakness or numbness.   10-point ROS otherwise negative.  ____________________________________________   PHYSICAL EXAM:  VITAL SIGNS: ED Triage Vitals  Enc Vitals Group     BP 10/22/15 0325 141/77 mmHg     Pulse Rate 10/22/15 0325 94     Resp 10/22/15 0325 18     Temp 10/22/15 0325 98.2 F (36.8 C)     Temp Source 10/22/15 0325 Oral  SpO2 10/22/15 0325 98 %     Weight 10/22/15 0325 240 lb (108.863 kg)     Height 10/22/15 0325  (1.854 m)     Head Cir --      Peak Flow --      Pain Score 10/22/15 0326 10     Pain Loc --      Pain Edu? --      Excl. in GC? --      Constitutional: Alert and oriented. Apparent discomfort. Eyes: Conjunctivae are normal. PERRL. Normal extraocular movements. ENT   Head: Normocephalic and atraumatic.   Nose: No congestion/rhinnorhea.   Mouth/Throat: Mucous membranes are moist.   Neck: No stridor. Gastrointestinal: Soft and nontender. No distention. There is no CVA tenderness. Genitourinary: deferred Musculoskeletal:Swelling/contusion  noted to the dorsal aspect of the right hand  Neurologic:  Normal speech and language. No gross focal neurologic deficits are appreciated. Speech is normal.  Skin:  Skin is warm, dry and intact. No rash noted. Contusion to the dorsal aspect of the right hand    RADIOLOGY   DG Hand Complete Right (Final result) Result time: 10/22/15 04:02:45   Final result by Rad Results In Interface (10/22/15 04:02:45)   Narrative:   CLINICAL DATA: Crush injury. Right hand and wrist pain after dropping 40 pound weight on hand/wrist.  EXAM: RIGHT HAND - COMPLETE 3+ VIEW  COMPARISON: Hand and wrist radiographs 12/24/2013  FINDINGS: There is no evidence of fracture or dislocation. There is no evidence of other focal bone abnormality. Dorsal soft tissue edema about the metacarpals.  IMPRESSION: Dorsal soft tissue edema about the metacarpals. No fracture or subluxation.   Electronically Signed By: Rubye Oaks M.D. On: 10/22/2015 04:02          DG Wrist Complete Right (Final result) Result time: 10/22/15 04:04:15   Final result by Rad Results In Interface (10/22/15 04:04:15)   Narrative:   CLINICAL DATA: Right hand and wrist pain after dropping a 40 pound weight on it. Crush injury.  EXAM: RIGHT WRIST - COMPLETE 3+ VIEW  COMPARISON: Radiograph 12/24/2013  FINDINGS: There is no evidence of fracture or dislocation. There is no evidence of other focal bone abnormality. Scaphoid is intact. Dorsal soft tissue edema about the metacarpals.  IMPRESSION: No fracture or subluxation of the right wrist.   Electronically Signed By: Rubye Oaks M.D. On: 10/22/2015 04:04         INITIAL IMPRESSION / ASSESSMENT AND PLAN / ED COURSE  Pertinent labs & imaging results that were available during my care of the patient were reviewed by me and considered in my medical decision making (see chart for  details).    ____________________________________________   FINAL CLINICAL IMPRESSION(S) / ED DIAGNOSES  Final diagnoses:  Hand contusion, right, initial encounter      Darci Current, MD 10/22/15 [phone]

## 2015-10-22 NOTE — ED Notes (Signed)
Patient presents to ED after having dropped a 40 pound plate weight on his right hand. States his fingers are numb and hot. Hand with large hematoma to top of hand. Cap refill <3 secs.

## 2015-10-22 NOTE — ED Notes (Signed)
Pt given ice pack at this time.

## 2015-10-22 NOTE — Discharge Instructions (Signed)
Hand Contusion  A hand contusion is a deep bruise on your hand area. Contusions are the result of an injury that caused bleeding under the skin. The contusion may turn blue, purple, or yellow. Minor injuries will give you a painless contusion, but more severe contusions may stay painful and swollen for a few weeks.  CAUSES   A contusion is usually caused by a blow, trauma, or direct force to an area of the body.  SYMPTOMS    Swelling and redness of the injured area.   Discoloration of the injured area.   Tenderness and soreness of the injured area.   Pain.  DIAGNOSIS   The diagnosis can be made by taking a history and performing a physical exam. An X-ray, CT scan, or MRI may be needed to determine if there were any associated injuries, such as broken bones (fractures).  TREATMENT   Often, the best treatment for a hand contusion is resting, elevating, icing, and applying cold compresses to the injured area. Over-the-counter medicines may also be recommended for pain control.  HOME CARE INSTRUCTIONS    Put ice on the injured area.    Put ice in a plastic bag.    Place a towel between your skin and the bag.    Leave the ice on for 15-20 minutes, 03-04 times a day.   Only take over-the-counter or prescription medicines as directed by your caregiver. Your caregiver may recommend avoiding anti-inflammatory medicines (aspirin, ibuprofen, and naproxen) for 48 hours because these medicines may increase bruising.   If told, use an elastic wrap as directed. This can help reduce swelling. You may remove the wrap for sleeping, showering, and bathing. If your fingers become numb, cold, or blue, take the wrap off and reapply it more loosely.   Elevate your hand with pillows to reduce swelling.   Avoid overusing your hand if it is painful.  SEEK IMMEDIATE MEDICAL CARE IF:    You have increased redness, swelling, or pain in your hand.   Your swelling or pain is not relieved with medicines.   You have loss of feeling in  your hand or are unable to move your fingers.   Your hand turns cold or blue.   You have pain when you move your fingers.   Your hand becomes warm to the touch.   Your contusion does not improve in 2 days.  MAKE SURE YOU:    Understand these instructions.   Will watch your condition.   Will get help right away if you are not doing well or get worse.     This information is not intended to replace advice given to you by your health care provider. Make sure you discuss any questions you have with your health care provider.     Document Released: 11/04/2001 Document Revised: 02/07/2012 Document Reviewed: 11/06/2011  Elsevier Interactive Patient Education 2016 [employer]

## 2016-09-02 ENCOUNTER — Emergency Department: Admit: 2016-09-02

## 2016-09-02 ENCOUNTER — Inpatient Hospital Stay
Admit: 2016-09-02 | Discharge: 2016-09-02 | Disposition: A | Discharge status: Home / Self Care | Attending: Emergency Medicine

## 2016-09-02 DIAGNOSIS — J209 Acute bronchitis, unspecified: Secondary | ICD-10-CM

## 2016-09-02 MED ORDER — PROMETHAZINE-DM 6.25-15 MG/5ML PO SYRP
Freq: Four times a day (QID) | ORAL | 0 refills | Status: AC | PRN
Start: 2016-09-02 — End: 2016-09-09

## 2016-09-02 MED ORDER — PREDNISONE 20 MG PO TABS
20 MG | ORAL_TABLET | Freq: Every day | ORAL | 0 refills | Status: AC
Start: 2016-09-02 — End: 2016-09-07

## 2016-09-02 MED ORDER — AMOXICILLIN-POT CLAVULANATE 875-125 MG PO TABS
875-125 MG | ORAL_TABLET | Freq: Two times a day (BID) | ORAL | 0 refills | Status: AC
Start: 2016-09-02 — End: 2016-09-12

## 2016-09-02 NOTE — ED Provider Notes (Signed)
[facility]  eMERGENCY dEPARTMENT eNCOUnter          CHIEF COMPLAINT       Chief Complaint   Patient presents with   ??? Pharyngitis     sore throat for 3 days        Nurses Notes reviewed and I agree except as noted in the HPI.      HISTORY OF PRESENT ILLNESS    Roger Ochoa is a 31 y.o. male who presents To emergency room with complaint of sore throat for the past 3 days, occasional cough with yellow-green production, subjective clamminess and warmth was not had a absolute fever.  Patient denies any known sick contacts.  Patient has tried Chloraseptic spray without relief.  Patient is a smoker     REVIEW OF SYSTEMS     Review of Systems   All other systems reviewed and are negative.         PAST MEDICAL HISTORY    has no past medical history on file.    SURGICAL HISTORY      has a past surgical history that includes shoulder surgery and Hand surgery.    CURRENT MEDICATIONS       Previous Medications    No medications on file       ALLERGIES     has No Known Allergies.    FAMILY HISTORY     has no family status information on file.    family history is not on file.    SOCIAL HISTORY      reports that he has been smoking Cigarettes.  He has been smoking about 0.50 packs per day. He has never used smokeless tobacco.    PHYSICAL EXAM     INITIAL VITALS:  height is  (1.88 m) and weight is 231 lb 4.8 oz (104.9 kg). His oral temperature is 97.8 ??F (36.6 ??C). His blood pressure is 117/79 and his pulse is 78. His respiration is 20 and oxygen saturation is 98%.    Physical Exam   Constitutional: Patient is oriented to person, place, and time. Patient appears well-developed and well-nourished. Patient is active and cooperative.    HENT:   Head: Normocephalic and atraumatic. Head is without contusion.   Right Ear: Hearing and external ear normal. No drainage.   Left Ear: Hearing and external ear normal. No drainage.   Nose: Nose normal. No nasal deformity. No epistaxis.   Mouth/Throat: Mucous  membranes are not dry.  Noted erythema, there is no gross exudate however there are noted several off-white lesions measuring 1-2 mm along the tonsillar pillars and posterior pharynx  Eyes: EOMI. Conjunctivae, sclera, and lids are normal. Right eye exhibits no discharge. Left eye exhibits no discharge.   Neck: Full passive range of motion without pain and phonation normal.   Cardiovascular:  Normal rate, regular rhythm and intact distal pulses.   No edema.  Pulses: Right radial pulse  2+   Pulmonary/Chest: Effort normal. No tachypnea and no bradypnea.  Trace central coarseness  Abdominal: Soft. Patient without distension or tenderness  Musculoskeletal:   Negative acute trauma or deformity,  apparent full range of motion and normal strength all extremities appropriate to age.  Neurological: Patient is alert and oriented to person, place, and time. patient displays no tremor. Patient displays no seizure activity. .  Lymphatic:  Anterior cervical lymphadenopathy noted  Skin: Skin is warm and dry. Patient is not diaphoretic.  Psychiatric: Patient has a normal  mood and affect. Patient speech is normal and behavior is normal. Cognition and memory are normal.      DIFFERENTIAL DIAGNOSIS:   Pneumonia bronchitis URI strep throat viral pharyngitis     DIAGNOSTIC RESULTS           RADIOLOGY: non-plain film images(s) such as CT, Ultrasound and MRI are read by the radiologist.  XR CHEST STANDARD (2 VW)   Preliminary Result   Preliminary report only      Nonacute two-view chest.          LABS:   Labs Reviewed - No data to display    EMERGENCY DEPARTMENT COURSE:   Vitals:    Vitals:    09/02/16 1524   BP: 117/79   Pulse: 78   Resp: 20   Temp: 97.8 ??F (36.6 ??C)   TempSrc: Oral   SpO2: 98%   Weight: 231 lb 4.8 oz (104.9 kg)   Height:  (1.88 m)     Patient history physical exam taken at bedside, discussed patient's symptoms and exam findings as well as plan workup to include 2 view chest x-ray, will hold on any blood work or  swabs until imaging reviewed, acknowledged    Imaging reviewed    Discussed with patient imaging findings, x-ray to patient that as he is 3 days in the illness I would offer influenza swab and blood work or at this time I do not feel would change diagnosis, and patient declines any additional testing.  Confirmed allergies, we'll initiate patient on a five-day steroid burst and Phenergan DM for posterior sore throat symptoms as well as cough, and we'll give a SNAP prescription for Augmentin and patient's symptoms do not improve in 48 hours and/or he develops a fever.  Discussed OTC cough cold medications, importance of hydration, advised stop smoking, will refer to [facility] primary care for follow-up as needed, acknowledges    FINAL IMPRESSION      1. Acute bronchitis, unspecified organism    2. Sore throat          DISPOSITION/PLAN   Discharge    PATIENT REFERRED TO:  [facility]  [address]  [place] [address]  [phone]  Call         DISCHARGE MEDICATIONS:  New Prescriptions    AMOXICILLIN-CLAVULANATE (AUGMENTIN) 875-125 MG PER TABLET    Take 1 tablet by mouth 2 times daily for 10 days    PREDNISONE (DELTASONE) 20 MG TABLET    Take 3 tablets by mouth daily for 5 days    PROMETHAZINE-DEXTROMETHORPHAN (PROMETHAZINE-DM) 6.25-15 MG/5ML SYRUP    Take 5 mLs by mouth 4 times daily as needed for Cough           Summation      Patient Course:  Discharge    ED Medications administered this visit:  Medications - No data to display    New Prescriptions from this visit:    New Prescriptions    AMOXICILLIN-CLAVULANATE (AUGMENTIN) 875-125 MG PER TABLET    Take 1 tablet by mouth 2 times daily for 10 days    PREDNISONE (DELTASONE) 20 MG TABLET    Take 3 tablets by mouth daily for 5 days    PROMETHAZINE-DEXTROMETHORPHAN (PROMETHAZINE-DM) 6.25-15 MG/5ML SYRUP    Take 5 mLs by mouth 4 times daily as needed for Cough       Follow-up:  [facility]  [address]  [place]  [address]  [phone]  Call           Final Impression:   1. Acute bronchitis, unspecified organism    2. Sore throat               (Please note that portions of this note were completed with a voice recognition program.  Efforts were made to edit the dictations but occasionally words are mis-transcribed.)    Farrell Ours, MD            Farrell Ours, MD  09/02/16 [phone]

## 2016-09-02 NOTE — ED Triage Notes (Signed)
Pt not currently taking any medications

## 2016-10-13 ENCOUNTER — Emergency Department: Admit: 2016-10-14

## 2016-10-13 DIAGNOSIS — J209 Acute bronchitis, unspecified: Secondary | ICD-10-CM

## 2016-10-13 NOTE — ED Provider Notes (Signed)
[facility]  eMERGENCY dEPARTMENT eNCOUnter      Pt Name: Roger HoarKevin W Ochoa  MRN: [id number]  Birthdate 1986/01/24  Date of evaluation: 10/13/2016  Provider: Rolland PorterJames Landis Dowdy, MD    CHIEF COMPLAINT       Chief Complaint   Patient presents with   . Cough     patient to ED with c/o "deep cough" x 2 days     Patient is a 31 year old male presents to the emergency department complaining of cough for 2 days.  Patient states he's had a deep cough and has sinus congestion.  He denies fever but has had some chills.  He denies any abdominal pain and denies vomiting or diarrhea.  He states nothing is really making it better or worse.  He noted a few specks of blood in it tonight as well.        Nursing Notes were reviewed.    REVIEW OF SYSTEMS    (2-9 systems for level 4, 10 or more for level 5)     Review of Systems   Constitutional: Negative for chills and fever.   HENT: Positive for congestion and sinus pressure. Negative for ear pain, sore throat and trouble swallowing.    Eyes: Negative for pain and redness.   Respiratory: Positive for cough. Negative for shortness of breath.    Cardiovascular: Negative for chest pain and palpitations.   Gastrointestinal: Negative for abdominal pain, diarrhea, nausea and vomiting.   Genitourinary: Negative for dysuria and frequency.   Musculoskeletal: Negative for back pain and neck pain.   Skin: Negative for color change and rash.   Neurological: Negative for dizziness, syncope and headaches.   Psychiatric/Behavioral: Negative for hallucinations and suicidal ideas.       Except as noted above the remainder of the review of systems was reviewed and negative.       PAST MEDICAL HISTORY   History reviewed. No pertinent past medical history.      SURGICAL HISTORY       Past Surgical History:   Procedure Laterality Date   . HAND SURGERY      left thumb surgery   . SHOULDER SURGERY      left shoulder x 2         ALLERGIES     Patient has no known allergies.    FAMILY HISTORY     History  reviewed. No pertinent family history.       SOCIAL HISTORY       Social History     Social History   . Marital status: Single     Spouse name: N/A   . Number of children: N/A   . Years of education: N/A     Social History Main Topics   . Smoking status: Current Every Day Smoker     Packs/day: 0.50     Types: Cigarettes   . Smokeless tobacco: Never Used   . Alcohol use Yes   . Drug use: No   . Sexual activity: Yes     Partners: Female     Other Topics Concern   . None     Social History Narrative   . None           PHYSICAL EXAM    (up to 7 for level 4, 8 or more for level 5)     ED Triage Vitals [10/13/16 2048]   BP Temp Temp Source Pulse Resp SpO2 Height Weight   129/71 98.1 F (  36.7 C) Oral 74 20 94 % 6\' 1"  (1.854 m) 240 lb (108.9 kg)       Physical Exam     Physical    Vital signs and nursing notes were reviewed as well as the social, family, and past medical history.    Gen. appearance: Patient is alert and oriented and in no acute distress    Head: Atraumatic, normocephalic    Neck: Supple, trachea/thyroid normal    EENT: PERRLA, EOMI, conjunctiva normal.    Skin: Warm and dry with no rash    Cardiovascular: Heart RRR, no gallops or rubs, no aortic enlargement or bruits noted.    Respiratory: Lungs clear, no wheezing, no rales, normal breath sounds.    Gastrointestinal: Abdomen nontender, bowel sounds normal, no rebound/guarding/distention or mass    Musculoskeletal: No tenderness in the extremities, no back or hip pain.    Neurological: Patient is alert and oriented 3, no focal motor or sensory deficits noted,       DIAGNOSTIC RESULTS           EMERGENCY DEPARTMENT COURSE and DIFFERENTIAL DIAGNOSIS/MDM:   Vitals:    Vitals:    10/13/16 2048   BP: 129/71   Pulse: 74   Resp: 20   Temp: 98.1 F (36.7 C)   TempSrc: Oral   SpO2: 94%   Weight: 240 lb (108.9 kg)   Height: 6\' 1"  (1.854 m)                 REASSESSMENT    patient's chest x-ray was negative for pneumonia and we will treat for bronchitis and  discharged home      PROCEDURES:  Unless otherwise noted below, none     Procedures    FINAL IMPRESSION      1. Acute bronchitis, unspecified organism    2. Other acute sinusitis, recurrence not specified          DISPOSITION/PLAN   DISPOSITION Decision To Discharge 10/13/2016 09:57:19 PM      PATIENT REFERRED TO:  [facility] ED  [address] [place] [address]  [phone]    If symptoms worsen      DISCHARGE MEDICATIONS:  New Prescriptions    AZITHROMYCIN (ZITHROMAX Z-PAK) 250 MG TABLET    Take 1 z-pak as directed.    OXYMETAZOLINE (12 HOUR NASAL SPRAY) 0.05 % NASAL SPRAY    2 sprays by Nasal route 2 times daily          (Please note that portions of this note were completed with a voice recognition program.  Efforts were made to edit the dictations but occasionally words are mis-transcribed.)    Rolland Porter, MD (electronically signed)  Attending Emergency Physician            Rolland Porter, MD  10/13/16 2158

## 2016-10-13 NOTE — ED Notes (Signed)
Patient to ED with c/o "deep cough" x 2 days, c/o pain in rib area from coughing and " terrible headache"     Roger HalstedRobin L Moncia Annas, RN  10/13/16 2052

## 2016-10-13 NOTE — ED Triage Notes (Signed)
Patient is not on any meds at home

## 2016-10-14 ENCOUNTER — Inpatient Hospital Stay
Admit: 2016-10-14 | Discharge: 2016-10-14 | Disposition: A | Discharge status: Home / Self Care | Attending: Emergency Medicine

## 2016-10-14 MED ORDER — DILTIAZEM HCL 30 MG PO TABS
30 MG | Freq: Once | ORAL | Status: DC
Start: 2016-10-14 — End: 2016-10-13

## 2016-10-14 MED ORDER — OXYMETAZOLINE HCL 0.05 % NA SOLN
0.05 % | Freq: Two times a day (BID) | NASAL | 0 refills | Status: AC
Start: 2016-10-14 — End: 2016-11-12

## 2016-10-14 MED ORDER — AZITHROMYCIN 250 MG PO TABS
250 MG | PACK | ORAL | 0 refills | Status: DC
Start: 2016-10-14 — End: 2017-05-05

## 2016-11-19 ENCOUNTER — Emergency Department: Admit: 2016-11-20

## 2016-11-19 DIAGNOSIS — S93402A Sprain of unspecified ligament of left ankle, initial encounter: Secondary | ICD-10-CM

## 2016-11-19 NOTE — ED Triage Notes (Signed)
Pt states he takes no home medications.

## 2016-11-19 NOTE — ED Notes (Signed)
Writer elevates pt's foot and applies ice.     Quentin MullingKristen A Murriel Eidem, RN  11/19/16 2144

## 2016-11-19 NOTE — ED Provider Notes (Signed)
eMERGENCY dEPARTMENT eNCOUnter      CHIEF COMPLAINT    Chief Complaint   Patient presents with   . Ankle Pain     pt c/o left ankle injury 30 minutes PTA.  he states he rolled it playing basketball and the pain is shooting up his leg and his knee is sore as well.       HPI    Roger Ochoa is a 31 y.o. male who presentsto ED from Home  By private car  With complaint of left ankle pain  Onset 30 minutes ago  Intensity of symptoms mild  Location of symptoms left ankle  Patient here for evaluation of left ankle pain.  He rolled his ankle while playing basketball.  Complains of mild swelling on the higher aspect of his ankle.  Patient is able to ambulate but complains of pain with ambulation.  There are no open wound.   patient denies hitting his head or complaints of neck pain.      PAST MEDICAL HISTORY    History reviewed. No pertinent past medical history.    SURGICAL HISTORY    Past Surgical History:   Procedure Laterality Date   . HAND SURGERY      left thumb surgery   . SHOULDER SURGERY      left shoulder x 2       CURRENT MEDICATIONS    Current Outpatient Rx   Medication Sig Dispense Refill   . ibuprofen (IBU) 600 MG tablet Take 1 tablet by mouth every 6 hours as needed for Pain 120 tablet 0   . azithromycin (ZITHROMAX Z-PAK) 250 MG tablet Take 1 z-pak as directed. 1 packet 0       ALLERGIES    No Known Allergies    FAMILY HISTORY    History reviewed. No pertinent family history.    SOCIAL HISTORY    Social History     Social History   . Marital status: Single     Spouse name: N/A   . Number of children: N/A   . Years of education: N/A     Social History Main Topics   . Smoking status: Current Every Day Smoker     Packs/day: 0.25     Types: Cigarettes   . Smokeless tobacco: Never Used   . Alcohol use Yes   . Drug use: No   . Sexual activity: Yes     Partners: Female     Other Topics Concern   . None     Social History Narrative   . None       REVIEW OF SYSTEMS    Constitutional:  Denies fever, chills  Eyes:   Denies photophobia or discharge   HENT:  Denies sore throat or ear pain   Respiratory:  Denies cough or shortness of breath   Cardiovascular:  Denies chest pain, palpitations or swelling   GI:  Denies abdominal pain, nausea, vomiting, or diarrhea   Musculoskeletal:  C/o Left Ankle Pain, Denies back pain   Skin:  Denies rash   Neurologic:  Denies headache, focal weakness or sensory changes   Endocrine:  Denies polyuria or polydypsia   Lymphatic:  Denies swollen glands   Psychiatric:  Denies depression, suicidal ideation or homicidal ideation   All systems negative except as marked.     PHYSICAL EXAM    VITAL SIGNS: BP 126/79   Pulse 90   Temp 98 F (36.7 C) (Oral)   Resp 18  Wt 215 lb (97.5 kg)   SpO2 98%   BMI 28.37 kg/m    Constitutional:  Well developed, Well nourished, No acute distress, Non-toxic appearance.   HENT:  Normocephalic, Atraumatic, Bilateral external ears normal, Oropharynx moist, No oral exudates, Nose normal. Neck- Normal range of motion, No tenderness, Supple, No stridor.   Eyes:  PERRL, EOMI, Conjunctiva normal, No discharge.   Respiratory:  Normal breath sounds, No respiratory distress, No wheezing, No chest tenderness.   Cardiovascular:  Normal heart rate, Normal rhythm, No murmurs, No rubs, No gallops.   GI:  Bowel sounds normal, Soft, No tenderness, No masses, No pulsatile masses.   GU:  No CVA tenderness.   Musculoskeletal:  Left ankle mild swelling on the lateral aspect, no bony tenderness, range of motion limited secondary to pain, distal neurovascular intact.  Left knee-no tenderness, normal range of motion no effusion.  Back- No tenderness.   Integument:  Warm, Dry, No erythema, No rash.   Lymphatic:  No lymphadenopathy noted.   Neurologic:  Alert & oriented x 3, Normal motor function, Normal sensory function, No focal deficits noted.   Psychiatric:  Affect normal, Judgment normal, Mood normal.       RADIOLOGY    XR ANKLE LEFT (MIN 3 VIEWS)   Final Result   No ankle fracture  identified      LEFT FOOT AP lateral and oblique 11/19/2016      No foot fracture is evident. Joint spacing looks appropriate.         IMPRESSION: No acute process in the foot is evident         XR FOOT LEFT (MIN 3 VIEWS)   Final Result   No ankle fracture identified      LEFT FOOT AP lateral and oblique 11/19/2016      No foot fracture is evident. Joint spacing looks appropriate.         IMPRESSION: No acute process in the foot is evident             REEVALUATION   Pain control adequate.  Patient ambulating.  Updated results of the x-rays with the patient.  PROCEDURES    Labs  Labs Reviewed - No data to display          Summation      Patient Course:     ED Medications administered this visit:    Medications   ibuprofen (ADVIL;MOTRIN) tablet 800 mg (800 mg Oral Given 11/19/16 2258)       New Prescriptions from this visit:    Discharge Medication List as of 11/19/2016 10:34 PM      START taking these medications    Details   ibuprofen (IBU) 600 MG tablet Take 1 tablet by mouth every 6 hours as needed for Pain, Disp-120 tablet, R-0Print             Follow-up:  No follow-up provider specified.      Final Impression:   1. Sprain of left ankle, unspecified ligament, subsequent encounter               (Please note that portions of this note were completed with a voice recognition program.  Efforts were made to edit the dictations but occasionally words are mis-transcribed.)            Faylene MillionPrashant Blessen Kimbrough, MD  11/21/16 [phone]

## 2016-11-19 NOTE — Discharge Instructions (Signed)
Please return if worse.  Please keep Limb elevated

## 2016-11-20 ENCOUNTER — Inpatient Hospital Stay
Admit: 2016-11-20 | Discharge: 2016-11-20 | Disposition: A | Discharge status: Home / Self Care | Attending: Emergency Medicine

## 2016-11-20 MED ORDER — IBUPROFEN 200 MG PO TABS
200 MG | Freq: Once | ORAL | Status: AC
Start: 2016-11-20 — End: 2016-11-19
  Administered 2016-11-20: 03:00:00 800 mg via ORAL

## 2016-11-20 MED ORDER — IBUPROFEN 600 MG PO TABS
600 MG | ORAL_TABLET | Freq: Four times a day (QID) | ORAL | 0 refills | Status: DC | PRN
Start: 2016-11-20 — End: 2017-05-05

## 2016-11-20 MED FILL — IBU-200 200 MG PO TABS: 200 MG | ORAL | Qty: 4

## 2016-11-20 MED FILL — IBUPROFEN 200 MG PO TABS: 200 MG | ORAL | Qty: 3

## 2017-01-29 IMAGING — CR DG SHOULDER 2+V*L*
3 series · 3 of 3 positions shown · non-contrast
Comparison: 10/31/2014 dating back to 09/07/2007.

CLINICAL DATA: Frequent recurrent left shoulder dislocation after
an initial injury with approximately 7 years ago. Three week history
of left shoulder pain currently. No recent injuries. Prior left
shoulder surgery x 2.

EXAM:
LEFT SHOULDER - 2+ VIEW

[shoulder grashey]
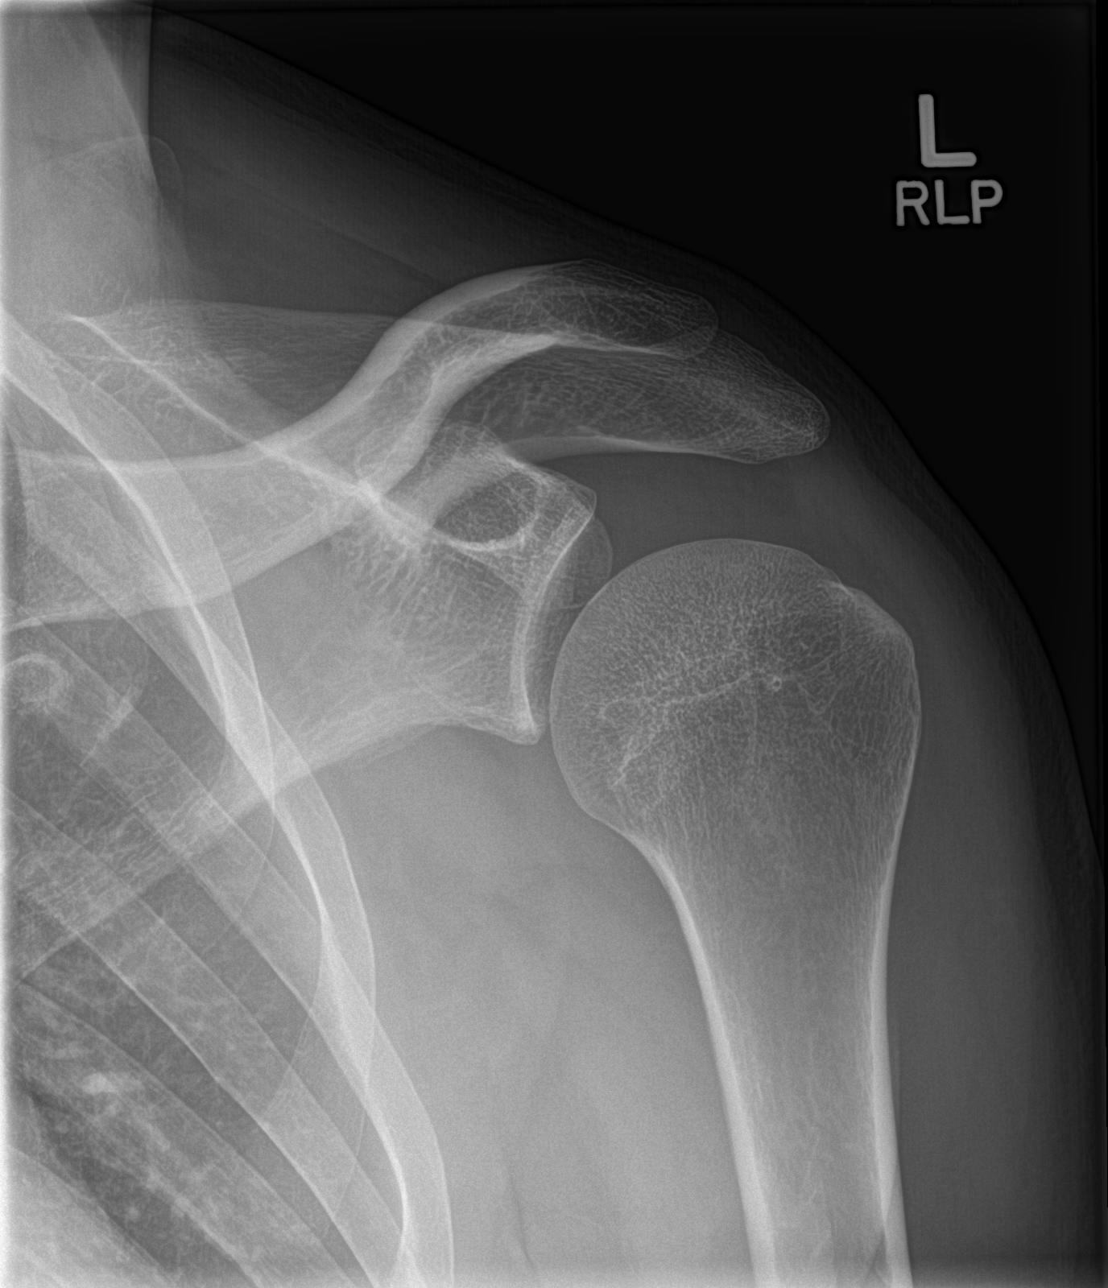

[shoulder y view]
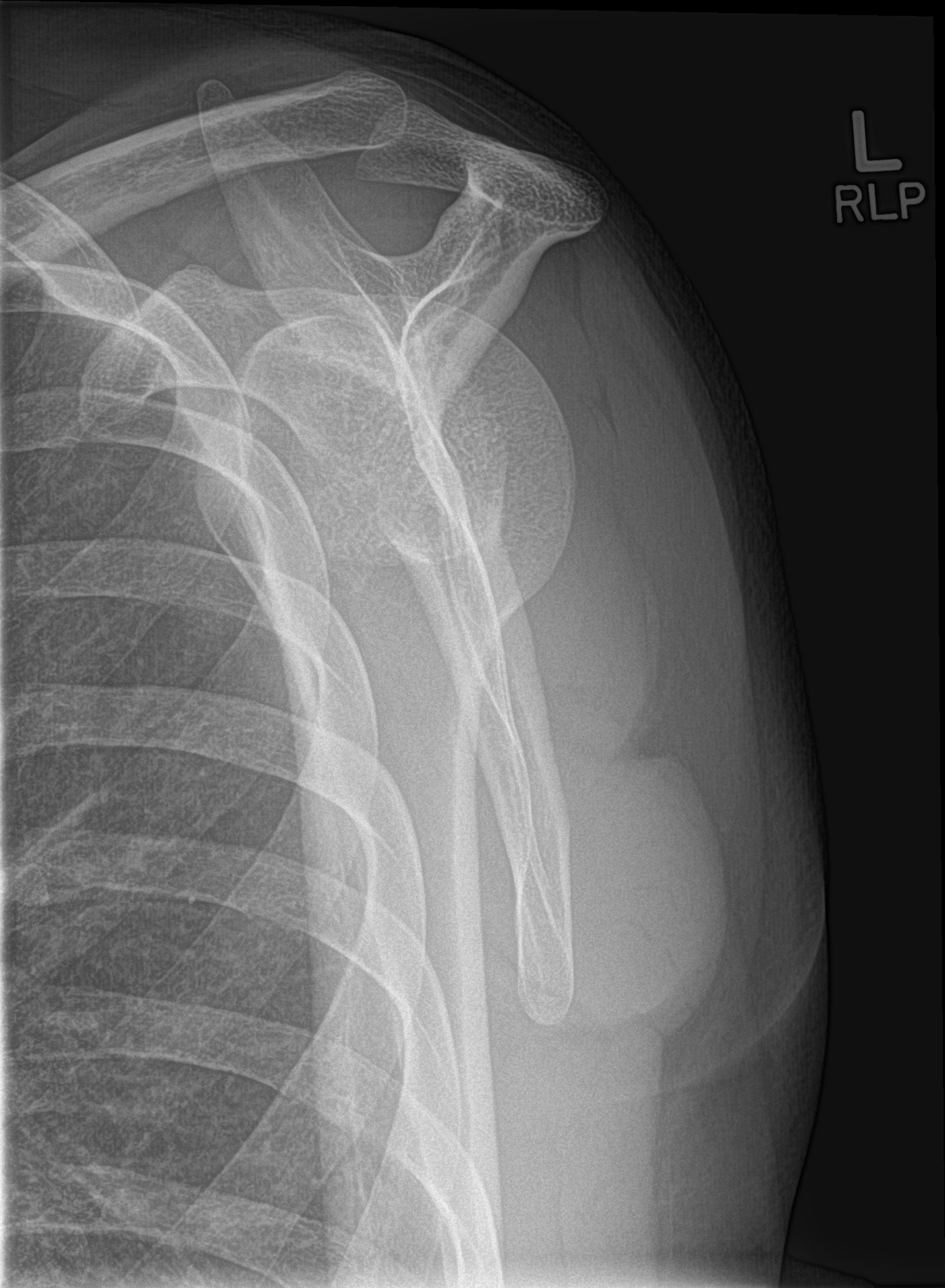

[shoulder axillary]
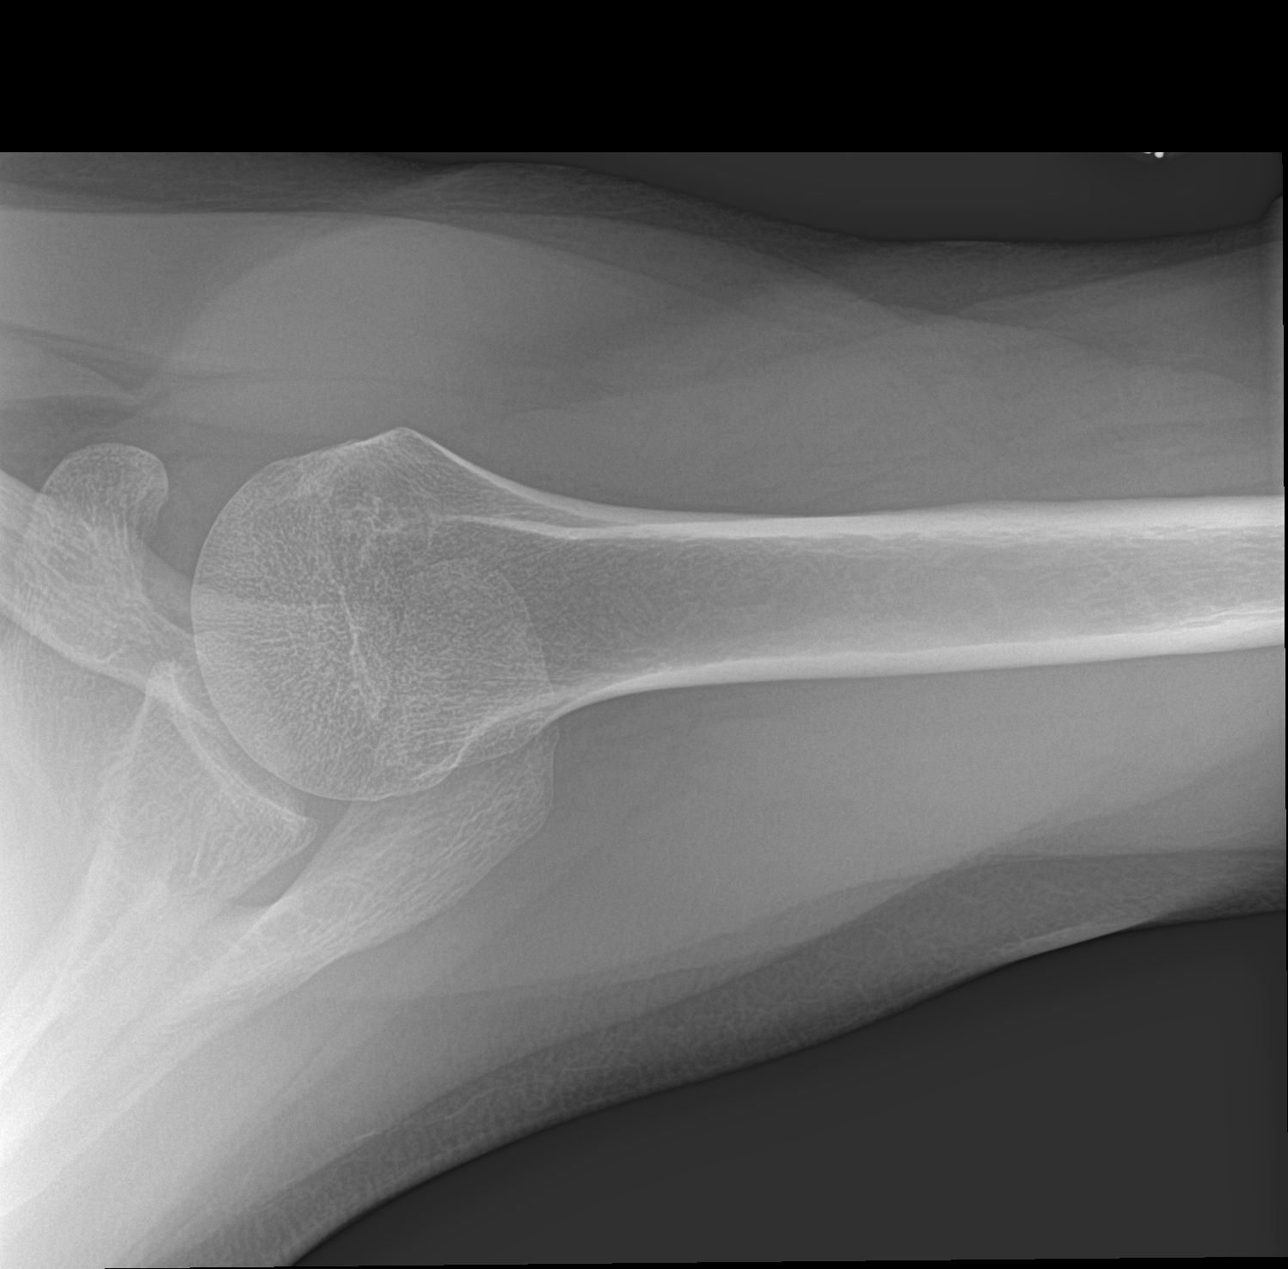

[3 of 3 positions shown; findings below may reference images not displayed]

FINDINGS: No evidence of acute fracture or glenohumeral dislocation.
Subacromial space well preserved. Acromioclavicular joint intact.
Well preserved bone mineral density. No intrinsic osseous
abnormality.
IMPRESSION: Normal examination.

## 2017-05-05 ENCOUNTER — Emergency Department: Admit: 2017-05-06

## 2017-05-05 DIAGNOSIS — S60021A Contusion of right index finger without damage to nail, initial encounter: Secondary | ICD-10-CM

## 2017-05-05 NOTE — ED Provider Notes (Signed)
eMERGENCY dEPARTMENT eNCOUnter        CHIEF COMPLAINT    Chief Complaint   Patient presents with   . Finger Injury     R index finger injury 6 days ago in car door.        HPI    Roger Ochoa is a 31 y.o. male who presents to the ER after smashing his index finger in a car door 6 days ago.  He describes his symptoms as being moderate in severity.  Patient complains of pain in his right index finger with decreased range of motion due to pain.    REVIEW OF SYSTEMS    All systems reviewed and positives in history of present illness.    PAST MEDICAL HISTORY    History reviewed. No pertinent past medical history.    SURGICAL HISTORY    Past Surgical History:   Procedure Laterality Date   . HAND SURGERY      left thumb surgery   . SHOULDER SURGERY      left shoulder x 2       CURRENT MEDICATIONS    Current Outpatient Rx   Medication Sig Dispense Refill   . naproxen (NAPROSYN) 500 MG tablet Take 1 tablet by mouth 2 times daily 20 tablet 0       ALLERGIES    No Known Allergies    FAMILY HISTORY    History reviewed. No pertinent family history.    SOCIAL HISTORY    Social History     Social History   . Marital status: Single     Spouse name: N/A   . Number of children: N/A   . Years of education: N/A     Social History Main Topics   . Smoking status: Current Every Day Smoker     Packs/day: 0.25     Types: Cigarettes   . Smokeless tobacco: Never Used   . Alcohol use Yes   . Drug use: No   . Sexual activity: Yes     Partners: Female     Other Topics Concern   . None     Social History Narrative   . None       PHYSICAL EXAM    VITAL SIGNS: BP (!) 145/79   Pulse 87   Temp 97.7 F (36.5 C) (Oral)   Resp 18   Ht 6\' 1"  (1.854 m)   Wt 227 lb (103 kg)   SpO2 95%   BMI 29.95 kg/m   Constitutional:  Well developed, well nourished, moderate acute distress, non-toxic appearance   HENT:  Atraumatic, external ears normal, nose normal, oropharynx moist.  Neck- normal range of motion, no tenderness, supple   Respiratory:  No  respiratory distress, normal breath sounds.   Cardiovascular:  Normal rate, normal rhythm, no murmurs, no gallops, no rubs   GI:  Soft, nondistended, normal bowel sounds, nontender   Musculoskeletal:  Right index finger tender and swollen.  Decreased range of motion due to pain.  Integument:  Well hydrated, no rash   Neurologic:  Grossly intact    RADIOLOGY/PROCEDURES    X-rays of right index finger were negative for fracture.    ED COURSE & MEDICAL DECISION MAKING    Pertinent Labs & Imaging studies reviewed. (See chart for details)  Splint was applied in the ER.  Treat with Naprosyn.  Patient was stable on discharge.    FINAL IMPRESSION    1.  Contusion right index finger  2.  Summation      Patient Course: X-rays was negative for fracture.  Patient was stable on discharge.    ED Medications administered this visit:  Medications - No data to display    New Prescriptions from this visit:    Discharge Medication List as of 05/05/2017 11:07 PM      START taking these medications    Details   naproxen (NAPROSYN) 500 MG tablet Take 1 tablet by mouth 2 times daily, Disp-20 tablet, R-0Print             Follow-up:  Your Physician    Call in 2 days          Final Impression:   1. Contusion of right index finger without damage to nail, initial encounter               (Please note that portions of this note were completed with a voice recognition program.  Efforts were made to edit the dictations but occasionally words are mis-transcribed.)       Jaclyn PrimeJoseph A Mirko Tailor, MD  05/05/17 [phone]

## 2017-05-05 NOTE — Discharge Instructions (Signed)
Wear splint for 2 days.

## 2017-05-06 ENCOUNTER — Inpatient Hospital Stay
Admit: 2017-05-06 | Discharge: 2017-05-06 | Disposition: A | Discharge status: Home / Self Care | Attending: Family Medicine

## 2017-05-06 MED ORDER — NAPROXEN 500 MG PO TABS
500 MG | ORAL_TABLET | Freq: Two times a day (BID) | ORAL | 0 refills | Status: DC
Start: 2017-05-06 — End: 2017-06-11

## 2017-06-11 DIAGNOSIS — J029 Acute pharyngitis, unspecified: Secondary | ICD-10-CM

## 2017-06-11 NOTE — ED Triage Notes (Signed)
Pt does not take any daily medications.

## 2017-06-11 NOTE — ED Provider Notes (Addendum)
eMERGENCY dEPARTMENT eNCOUnter      CHIEF COMPLAINT    Chief Complaint   Patient presents with   . Fever     Pt has fever and chills since yesterday.  Cough and congestion.  Sore throat and hurts to swallow.        HPI    Roger Ochoa is a 32 y.o. male who presentsto ED from home.  By car.  With complaint of fever, chills, sore throat.  Onset since yesterday.  Intensity of symptoms moderate.  Complains of fever and sore throat.  The fever started yesterday.  Patient is a smoker.  PAST MEDICAL HISTORY    History reviewed. No pertinent past medical history.    SURGICAL HISTORY    Past Surgical History:   Procedure Laterality Date   . HAND SURGERY      left thumb surgery   . SHOULDER SURGERY      left shoulder x 2       CURRENT MEDICATIONS        ALLERGIES    No Known Allergies    FAMILY HISTORY    History reviewed. No pertinent family history.    SOCIAL HISTORY    Social History     Social History   . Marital status: Single     Spouse name: N/A   . Number of children: N/A   . Years of education: N/A     Social History Main Topics   . Smoking status: Former Smoker     Packs/day: 0.25     Types: Cigarettes   . Smokeless tobacco: Never Used   . Alcohol use Yes   . Drug use: No   . Sexual activity: Yes     Partners: Female     Other Topics Concern   . None     Social History Narrative   . None       REVIEW OF SYSTEMS    Constitutional:  Denies fever, chills, weight loss or weakness   Eyes:  Denies photophobia or discharge   HENT:  Positive  for sore throat.  Respiratory:  Denies cough or shortness of breath   Cardiovascular:  Denies chest pain, palpitations or swelling   GI:  Denies abdominal pain, nausea, vomiting, or diarrhea   Musculoskeletal:  Denies back pain   Skin:  Denies rash   Neurologic:  Denies headache, focal weakness or sensory changes   Endocrine:  Denies polyuria or polydypsia   Lymphatic:  Denies swollen glands   Psychiatric:  Denies depression, suicidal ideation or homicidal ideation   All systems  negative except as marked.     PHYSICAL EXAM    VITAL SIGNS: BP 127/71   Pulse 80   Temp 99.5 F (37.5 C) (Oral)   Resp 20   Ht 6\' 1"  (1.854 m)   Wt 201 lb 1.6 oz (91.2 kg)   SpO2 100%   BMI 26.53 kg/m    Constitutional:  Ill-appearing male  HENT:  Normocephalic, Atraumatic, Bilateral external ears normal, Oropharynx moist, No oral exudates, Nose normal. Neck- Normal range of motion, No tenderness, Supple, No stridor.   Pharyngeal erythema with exudate.  Right anterior cervical lymphadenopathy  Eyes:  PERRL, EOMI, Conjunctiva normal, No discharge.   Respiratory:  Normal breath sounds, No respiratory distress, No wheezing, No chest tenderness.   Cardiovascular:  Normal heart rate, Normal rhythm, No murmurs, No rubs, No gallops.   GI:  Bowel sounds normal, Soft, No tenderness, No masses, No  pulsatile masses.   GU: External genitalia appear normal, No masses or lesions. No discharge. No CVA tenderness.   Musculoskeletal:  Intact distal pulses, No edema, No tenderness, No cyanosis, No clubbing. Good range of motion in all major joints. No tenderness to palpation or major deformities noted. Back- No tenderness.   Integument:  Warm, Dry, No erythema, No rash.   Lymphatic:  No lymphadenopathy noted.   Neurologic:  Alert & oriented x 3, Normal motor function, Normal sensory function, No focal deficits noted.   Psychiatric:  Affect normal, Judgment normal, Mood normal.     EKG        RADIOLOGY    No orders to display       PROCEDURES        Labs  Labs Reviewed   STREP SCREEN GROUP A THROAT             Summation      Patient Course: Strep screen is negative.  Viral pharyngitis, infectious mononucleosis are in the  differential diagnosis.  Warning signs were discussed.  Return to ED if worse.  ED Medications administered this visit:    Medications   amoxicillin (AMOXIL) capsule 500 mg (not administered)   predniSONE (DELTASONE) tablet 40 mg (not administered)       New Prescriptions from this visit:    New  Prescriptions    AMOXICILLIN (AMOXIL) 500 MG CAPSULE    Take 1 capsule by mouth 3 times daily for 10 days    PREDNISONE (DELTASONE) 20 MG TABLET    Take 2 tablets by mouth daily for 3 doses       Follow-up:  [facility] [place] [facility]  [address]  [place] [id number]  [phone]    As needed, If symptoms worsen        Final Impression:   1. Acute pharyngitis, unspecified etiology               (Please note that portions of this note were completed with a voice recognition program.  Efforts were made to edit the dictations but occasionally words are mis-transcribed.)        Deanna ArtisVeselin Faith Patricelli, MD  06/11/17 [phone]       Deanna ArtisVeselin Viviane Semidey, MD  06/12/17 [phone]

## 2017-06-12 ENCOUNTER — Inpatient Hospital Stay
Admit: 2017-06-12 | Discharge: 2017-06-12 | Disposition: A | Discharge status: Home / Self Care | Attending: Emergency Medicine

## 2017-06-12 LAB — STREP SCREEN GROUP A THROAT

## 2017-06-12 MED ORDER — PREDNISONE 20 MG PO TABS
20 MG | ORAL_TABLET | Freq: Every day | ORAL | 0 refills | Status: AC
Start: 2017-06-12 — End: 2017-06-14

## 2017-06-12 MED ORDER — AMOXICILLIN 500 MG PO CAPS
500 MG | ORAL_CAPSULE | Freq: Three times a day (TID) | ORAL | 0 refills | Status: AC
Start: 2017-06-12 — End: 2017-06-21

## 2017-06-12 MED ORDER — PREDNISONE 20 MG PO TABS
20 MG | Freq: Every day | ORAL | Status: DC
Start: 2017-06-12 — End: 2017-06-11

## 2017-06-12 MED ORDER — PREDNISONE 20 MG PO TABS
20 MG | Freq: Once | ORAL | Status: AC
Start: 2017-06-12 — End: 2017-06-11
  Administered 2017-06-12: 03:00:00 40 mg via ORAL

## 2017-06-12 MED ORDER — AMOXICILLIN 500 MG PO CAPS
500 MG | Freq: Once | ORAL | Status: AC
Start: 2017-06-12 — End: 2017-06-11
  Administered 2017-06-12: 03:00:00 500 mg via ORAL

## 2017-06-12 MED FILL — AMOXICILLIN 500 MG PO CAPS: 500 mg | ORAL | Qty: 1

## 2017-06-12 MED FILL — PREDNISONE 20 MG PO TABS: 20 mg | ORAL | Qty: 2

## 2017-08-25 ENCOUNTER — Emergency Department: Admit: 2017-08-25

## 2017-08-25 ENCOUNTER — Inpatient Hospital Stay
Admit: 2017-08-25 | Discharge: 2017-08-25 | Disposition: A | Discharge status: Home / Self Care | Attending: Emergency Medicine

## 2017-08-25 DIAGNOSIS — S60221A Contusion of right hand, initial encounter: Secondary | ICD-10-CM

## 2017-08-25 MED ORDER — IBUPROFEN 200 MG PO TABS
200 MG | Freq: Once | ORAL | Status: AC
Start: 2017-08-25 — End: 2017-08-25
  Administered 2017-08-25: 09:00:00 600 mg via ORAL

## 2017-08-25 MED ORDER — TETANUS-DIPHTH-ACELL PERTUSSIS 5-2.5-18.5 LF-MCG/0.5 IM SUSP
Freq: Once | INTRAMUSCULAR | Status: AC
Start: 2017-08-25 — End: 2017-08-25
  Administered 2017-08-25: 09:00:00 0.5 mL via INTRAMUSCULAR

## 2017-08-25 MED FILL — IBUPROFEN 200 MG PO TABS: 200 mg | ORAL | Qty: 3

## 2017-08-25 MED FILL — BOOSTRIX 5-2.5-18.5 LF-MCG/0.5 IM SUSP: 5-2.5-18.5 LF-MCG/0.5 | INTRAMUSCULAR | Qty: 0.5

## 2017-08-25 NOTE — Discharge Instructions (Signed)
Return to the Emergency Department immediately if you develop worsening pain , or you have any other concerns.  Please follow up with your family doctor in 1-2 days.

## 2017-08-25 NOTE — ED Provider Notes (Signed)
eMERGENCY dEPARTMENT eNCOUnter      CHIEF COMPLAINT    Chief Complaint   Patient presents with   ??? Hand Injury     Pt states that he was hitting things with his hand d/t he is stressed out;        HPI    Roger Ochoa is a 32 y.o. male who presentsto ED from home  By private car with girlfriend  With complaint of R hand injury  Onset just PTA  Intensity of symptoms mild  He punched the wall and has a cut on his middle finger.   HIS TETANUS IS OUTDATED AND HE HAS BEEN DRINKING ALCOHOL      PAST MEDICAL HISTORY    History reviewed. No pertinent past medical history.    SURGICAL HISTORY    Past Surgical History:   Procedure Laterality Date   ??? HAND SURGERY      left thumb surgery   ??? SHOULDER SURGERY      left shoulder x 2       CURRENT MEDICATIONS    Current Outpatient Rx   Medication Sig Dispense Refill   ??? ibuprofen (ADVIL;MOTRIN) 800 MG tablet Take 800 mg by mouth every 6 hours as needed for Pain or Fever         ALLERGIES    Allergies   Allergen Reactions   ??? Dilaudid [Hydromorphone Hcl] Other (See Comments)     Pt states that when he was given dilaudid during surgery that he woke with defib patches on his chest and that the staff told him it eas d/t a reaction from the dilaudid       FAMILY HISTORY    History reviewed. No pertinent family history.    SOCIAL HISTORY    Social History     Socioeconomic History   ??? Marital status: Single     Spouse name: None   ??? Number of children: None   ??? Years of education: None   ??? Highest education level: None   Occupational History   ??? None   Social Needs   ??? Financial resource strain: None   ??? Food insecurity:     Worry: None     Inability: None   ??? Transportation needs:     Medical: None     Non-medical: None   Tobacco Use   ??? Smoking status: Current Every Day Smoker     Packs/day: 0.50     Years: 2.00     Pack years: 1.00     Types: Cigarettes   ??? Smokeless tobacco: Never Used   Substance and Sexual Activity   ??? Alcohol use: Yes     Comment: had some drinks last night,  but states that he only drinks on occasion   ??? Drug use: No   ??? Sexual activity: Yes     Partners: Female   Lifestyle   ??? Physical activity:     Days per week: None     Minutes per session: None   ??? Stress: None   Relationships   ??? Social connections:     Talks on phone: None     Gets together: None     Attends religious service: None     Active member of club or organization: None     Attends meetings of clubs or organizations: None     Relationship status: None   ??? Intimate partner violence:     Fear of current or ex partner:  None     Emotionally abused: None     Physically abused: None     Forced sexual activity: None   Other Topics Concern   ??? None   Social History Narrative   ??? None       REVIEW OF SYSTEMS    Constitutional:  Denies fever, chills, weight loss or weakness   Eyes:  Denies photophobia or discharge   HENT:  Denies sore throat or ear pain   Respiratory:  Denies cough or shortness of breath   Cardiovascular:  Denies chest pain, palpitations or swelling   GI:  Denies abdominal pain, nausea, vomiting, or diarrhea   Musculoskeletal:  Denies back pain   Skin:  Denies rash   Neurologic:  Denies headache, focal weakness or sensory changes   Endocrine:  Denies polyuria or polydypsia   Lymphatic:  Denies swollen glands   Psychiatric:  Denies depression, suicidal ideation or homicidal ideation   All systems negative except as marked.     PHYSICAL EXAM    VITAL SIGNS: BP 123/72    Pulse 93    Temp 98 ??F (36.7 ??C) (Oral)    Resp 16    Ht 6\' 1"  (1.854 m)    Wt 194 lb 4.8 oz (88.1 kg)    SpO2 98%    BMI 25.63 kg/m??    Constitutional:  Alcohol breath, No acute distress, Non-toxic appearance.   HENT:  Normocephalic, Atraumatic, Bilateral external ears normal, Oropharynx moist, No oral exudates, Nose normal. Neck- Normal range of motion, No tenderness, Supple, No stridor.   Eyes:  PERRL, EOMI, Conjunctiva normal, No discharge.   Respiratory:  Normal breath sounds, No respiratory distress, No wheezing, No chest  tenderness.   Cardiovascular:  Normal heart rate, Normal rhythm, No murmurs, No rubs, No gallops.   GI:  Bowel sounds normal, Soft, No tenderness, No masses, No pulsatile masses.   GU:  No CVA tenderness.   Musculoskeletal: R hand contusion , Laceration on the middle finger , Distal NV intact, ROM normal, No Bony tenderness,    Intact distal pulses, No edema, No tenderness, No cyanosis, No clubbing. Good range of motion in all major joints. No tenderness to palpation or major deformities noted. Back- No tenderness.   Integument:  Warm, Dry, No erythema, No rash.   Lymphatic:  No lymphadenopathy noted.   Neurologic:  Alert & oriented x 3, Normal motor function, Normal sensory function, No focal deficits noted.   Psychiatric:  Affect normal, Judgment normal, Mood normal.         RADIOLOGY    XR HAND RIGHT (MIN 3 VIEWS)   Final Result      No definite fractures or dislocations.             REEVALUATION   Pain control adequate      PROCEDURES  Procedure Name: Laceration Repair  Location: R hand  Pre-Procedure Diagnosis: Laceration  Post-Procedure Diagnosis: Repaired Laceration  Informed consent was obtained before procedure started.  PROCEDURE:  The appropriate timeout was taken. The area was prepped and draped in the usual sterile fashion. The wound was  irrigated.  Glue used to repair. Estimated blood loss was less than 1 mL. An aluminium splint and ace wrap  was applied to the area and anticipatory guidance, as well as standard  post-procedure care, was explained. Return precautions are given. The patient tolerated the procedure well without complications.  Summation      Patient Course:     ED Medications administered this visit:    Medications   Tetanus-Diphth-Acell Pertussis (BOOSTRIX) injection 0.5 mL (0.5 mLs Intramuscular Given 08/25/17 0432)   ibuprofen (ADVIL;MOTRIN) tablet 600 mg (600 mg Oral Given 08/25/17 0432)       New Prescriptions from this visit:    Discharge Medication List as of  08/25/2017  4:29 AM          Follow-up:  your PCP    Call in 2 days          Final Impression:   1. Contusion of right hand, initial encounter               (Please note that portions of this note were completed with a voice recognition program.  Efforts were made to edit the dictations but occasionally words are mis-transcribed.)            Faylene Million, MD  08/27/17 1116

## 2017-08-25 NOTE — ED Notes (Signed)
On arrival pt would/could not bend his fingers and only abrasions were noted to knuckles/hand; After pt had been at ED for approx 45-60 minutes pt began moving his fingers and a laceration is noted to right index finger when the finger is in flexion; Physician made aware he applied skin affix to the laceration, covered with dry drsg, secured with kerlix and finger splint;      Jule EconomyKimberly Willson Lipa, RN  08/25/17 [phone]

## 2017-08-25 NOTE — ED Triage Notes (Signed)
Med list verified verbally with pt;

## 2017-08-25 NOTE — ED Notes (Signed)
Pt given ice pack to apply to right hand and encouraged to keep RUE elevated;      Jule EconomyKimberly Waldo Damian, RN  08/25/17 [phone]

## 2018-12-11 ENCOUNTER — Inpatient Hospital Stay: Payer: MEDICAID

## 2018-12-11 ENCOUNTER — Ambulatory Visit: Admit: 2018-12-11 | Discharge: 2018-12-11 | Payer: MEDICAID | Attending: Family

## 2018-12-11 DIAGNOSIS — Z20828 Contact with and (suspected) exposure to other viral communicable diseases: Secondary | ICD-10-CM

## 2018-12-11 DIAGNOSIS — Z20822 Contact with and (suspected) exposure to covid-19: Secondary | ICD-10-CM

## 2018-12-11 NOTE — Patient Instructions (Addendum)
SURVEY:    You may be receiving a survey from [employer] regarding your visit today.    Please complete the survey to enable us to provide the highest quality of care to you and your family.    If you cannot score us a very good on any question, please call the office to discuss how we could of made your experience a very good one.    Thank you.  Patient Education        10 Things to Do When You Have COVID-19    Stay home.  Don't go to school, work, or public areas. And don't use public transportation, ride-shares, or taxis unless you have no choice. Leave your home only if you need to get medical care. But call the doctor's office first so they know you're coming. And wear a cloth face cover.     Ask before leaving isolation.  Talk with your doctor or other health professional about when it will be safe for you to leave isolation.     Wear a cloth face cover when you are around other people.  It can help stop the spread of the virus when you cough or sneeze.     Limit contact with people in your home.  If possible, stay in a separate bedroom and use a separate bathroom.     Avoid contact with pets and other animals.  If possible, have a friend or family member care for them while you're sick.     Cover your mouth and nose with a tissue when you cough or sneeze.  Then throw the tissue in the trash right away.     Wash your hands often, especially after you cough or sneeze.  Use soap and water, and scrub for at least 20 seconds. If soap and water aren't available, use an alcohol-based hand sanitizer.     Don't share personal household items.  These include bedding, towels, cups and glasses, and eating utensils.     Clean and disinfect your home every day.  Use household cleaners or disinfectant wipes or sprays. Take special care to clean things that you grab with your hands. These include doorknobs, remote controls, phones, and handles on your refrigerator and microwave. And don't forget countertops, tabletops,  bathrooms, and computer keyboards.     Take acetaminophen (Tylenol) to relieve fever and body aches.  Read and follow all instructions on the label.   Current as of: Oct 04, 2018??????????????????????????????Content Version: 12.5  ?? 2006-2020 [employer].   Care instructions adapted under license by [facility]. If you have questions about a medical condition or this instruction, always ask your healthcare professional. [employer] disclaims any warranty or liability for your use of this information.     Patient Education        Learning About Coronavirus (COVID-19)  Coronavirus (COVID-19): Overview  What is coronavirus (COVID-19)?  The coronavirus disease (COVID-19) is caused by a virus. It is an illness that was first found in [place], [place], in December 2019. It has since spread worldwide.  The virus can cause fever, cough, and trouble breathing. In severe cases, it can cause pneumonia and make it hard to breathe without help. It can cause death.  Coronaviruses are a large group of viruses. They cause the common cold. They also cause more serious illnesses like Middle East respiratory syndrome (MERS) and severe acute respiratory syndrome (SARS). COVID-19 is caused by a novel coronavirus. That means it's a new type that   has not been seen in people before.  This virus spreads person-to-person through droplets from coughing and sneezing. It can also spread when you are close to someone who is infected. And it can spread when you touch something that has the virus on it, such as a doorknob or a tabletop.  What can you do to protect yourself from coronavirus (COVID-19)?  The best way to protect yourself from getting sick is to:  ?? Avoid areas where there is an outbreak.  ?? Avoid contact with people who may be infected.  ?? Wash your hands often with soap or alcohol-based hand sanitizers.  ?? Avoid crowds and try to stay at least 6 feet away from other people.  ?? Wash your hands often, especially after you cough  or sneeze. Use soap and water, and scrub for at least 20 seconds. If soap and water aren't available, use an alcohol-based hand sanitizer.  ?? Avoid touching your mouth, nose, and eyes.  What can you do to avoid spreading the virus to others?  To help avoid spreading the virus to others:  ?? Cover your mouth with a tissue when you cough or sneeze. Then throw the tissue in the trash.  ?? Use a disinfectant to clean things that you touch often.  ?? Wear a cloth face cover if you have to go to public areas.  ?? Stay home if you are sick or have been exposed to the virus. Don't go to school, work, or public areas. And don't use public transportation, ride-shares, or taxis unless you have no choice.  ?? If you are sick:  ? Leave your home only if you need to get medical care. But call the doctor's office first so they know you're coming. And wear a face cover.  ? Wear the face cover whenever you're around other people. It can help stop the spread of the virus when you cough or sneeze.  ? Clean and disinfect your home every day. Use household cleaners and disinfectant wipes or sprays. Take special care to clean things that you grab with your hands. These include doorknobs, remote controls, phones, and handles on your refrigerator and microwave. And don't forget countertops, tabletops, bathrooms, and computer keyboards.  When to call for help  Call911 anytime you think you may need emergency care. For example, call if:  ?? You have severe trouble breathing. (You can't talk at all.)  ?? You have constant chest pain or pressure.  ?? You are severely dizzy or lightheaded.  ?? You are confused or can't think clearly.  ?? Your face and lips have a blue color.  ?? You pass out (lose consciousness) or are very hard to wake up.  Call your doctor now if you develop symptoms such as:  ?? Shortness of breath.  ?? Fever.  ?? Cough.  If you need to get care, call ahead to the doctor's office for instructions before you go. Make sure you wear a face  cover to prevent exposing other people to the virus.  Where can you get the latest information?  The following health organizations are tracking and studying this virus. Their websites contain the most up-to-date information. You'll also learn what to do if you think you may have been exposed to the virus.  ?? U.[place]. Centers for Disease Control and Prevention (CDC): The CDC provides updated news about the disease and travel advice. The website also tells you how to prevent the spread of infection. www.cdc.gov  ?? World Health   Organization (WHO): WHO offers information about the virus outbreaks. WHO also has travel advice. https://castaneda-walker.com/  Current as of: Oct 04, 2018??????????????????????????????Content Version: 12.5  ?? 2006-2020 [employer].   Care instructions adapted under license by [facility]. If you have questions about a medical condition or this instruction, always ask your healthcare professional. [employer] disclaims any warranty or liability for your use of this information.       ?? Practice meticulous handwashing and cover cough to prevent spread of infection.  ?? Advised to quarantine self at home until receives results from COVID-19 - will call with results.  ?? Do not return to work or school until symptoms have resolved and no fever for 72 hrs without medication.  ?? Initiated Get Well Loop and advised to call and schedule Virtual Visit with Dulce Sellar, APRN.  ?? Encouraged to increase fluids and rest  ?? Tylenol OTC PRN for pain, discomfort or fever as directed on package  ?? Cool mist humidifier  ?? Hot tea with honey and lemon for cough PRN  ?? Patient instructions given for suspected Covid 19 infection..  ?? To ER or call 911 if any increasing difficulty breathing, shortness of breath, inability to swallow, hives, rash, facial/tongue swelling or temp greater than 103 degrees.  ?? Follow up with PCP or Flu Clinic as needed if symptoms worsen or do not improve.

## 2018-12-11 NOTE — Progress Notes (Signed)
Roger Ochoa  04-May-1986    Chief Complaint   Patient presents with   ??? Concern For COVID-19     Patient presents today with fever, headache, diarrhea, nausea, cough       Respiratory Symptoms:  Patient complains of 3 day(s) history of fever: low grade, myalgias/arthralgias, headache, sore throat, shortness of breath, non-productive cough, nausea without vomiting, diarrhea and abdominal pain: crampimg.  Symptoms have been worsening with time. He denies any other symptoms .      Relevant PMH: No pertinent PMH.     Smoking history:  He  reports that he has been smoking cigarettes. He has a 1.00 pack-year smoking history. He has never used smokeless tobacco.     He has had ill contacts with covid-19.     Treatment to date: Acetaminophen, NSAID.    Travel screen completed:  Yes        HPI:  Started on Sunday with low grade fever, body aches, headache, sore throat, shortness of breath, dry cough, nausea and diarrhea with abdominal cramping.  Denies abdominal pain or vomiting.  Has had 1 diarrhea stool today.  He is able to retain fluids and is eating ok.  Denies any chest pain or difficulty breathing.  Denies runny nose or nasal congestion.  Denies any rash.  First day of employment at [employer] was yesterday, went in to work this AM and had temp of 99.8 and sent home.  Reports known exposure to Covid-19 through a family member.    Vitals:    12/11/18 1453   BP: 114/79   Pulse: 78   Resp: 16   Temp: 98.3 ??F (36.8 ??C)   SpO2: 96%   Weight: 224 lb (101.6 kg)      Physical Exam  Vitals signs and nursing note reviewed.   Constitutional:       General: He is not in acute distress.     Appearance: Normal appearance. He is well-developed. He is not ill-appearing or diaphoretic.      Comments: Well hydrated, non-toxic appearance.   HENT:      Head: Normocephalic and atraumatic.      Right Ear: Hearing, tympanic membrane, ear canal and external ear normal. No middle ear effusion. No mastoid tenderness. Tympanic membrane is not  injected, erythematous or bulging.      Left Ear: Hearing, tympanic membrane, ear canal and external ear normal.  No middle ear effusion. No mastoid tenderness. Tympanic membrane is not injected, erythematous or bulging.      Nose:      Right Sinus: Frontal sinus tenderness present. No maxillary sinus tenderness.      Left Sinus: Frontal sinus tenderness present. No maxillary sinus tenderness.      Mouth/Throat:      Lips: Pink.      Mouth: Mucous membranes are moist.      Pharynx: Oropharynx is clear. Uvula midline. No pharyngeal swelling, oropharyngeal exudate or posterior oropharyngeal erythema.   Eyes:      General:         Right eye: No discharge.         Left eye: No discharge.      Conjunctiva/sclera: Conjunctivae normal.      Pupils: Pupils are equal, round, and reactive to light.   Cardiovascular:      Rate and Rhythm: Normal rate and regular rhythm.      Heart sounds: Normal heart sounds, S1 normal and S2 normal. No murmur. No friction rub. No  gallop.    Pulmonary:      Effort: Pulmonary effort is normal. No accessory muscle usage or respiratory distress.      Breath sounds: Normal breath sounds and air entry. No decreased breath sounds, wheezing, rhonchi or rales.      Comments: Occasional dry cough.  Breath sounds clear B/L anterior and posterior lobes.  Chest expansion symmetrical.  No audible wheezing or respiratory distress.  No rales or rhonchi.  Abdominal:      General: Bowel sounds are normal. There is no distension.      Palpations: Abdomen is soft. Abdomen is not rigid. There is no fluid wave or mass.      Tenderness: There is no abdominal tenderness. There is no guarding or rebound. Negative signs include Murphy's sign and McBurney's sign.   Musculoskeletal: Normal range of motion.   Lymphadenopathy:      Cervical: No cervical adenopathy.      Right cervical: No superficial or posterior cervical adenopathy.     Left cervical: No superficial or posterior cervical adenopathy.   Skin:     General:  Skin is warm and dry.      Coloration: Skin is not pale.      Findings: No erythema or rash.   Neurological:      Mental Status: He is alert and oriented to person, place, and time.   Psychiatric:         Behavior: Behavior normal. Behavior is cooperative.          Assessment/Plan:  1. Suspected COVID-19 virus infection  - Covid-19 Ambulatory; Future    ?? Practice meticulous handwashing and cover cough to prevent spread of infection.  ?? Advised to quarantine self at home until receives results from COVID-19 - will call with results.  ?? Do not return to work or school until symptoms have resolved and no fever for 72 hrs without medication.  ?? Initiated Get Well Loop and advised to call and schedule Virtual Visit with Dulce SellarMatt Clingman, APRN.  ?? Encouraged to increase fluids and rest  ?? Tylenol OTC PRN for pain, discomfort or fever as directed on package  ?? Cool mist humidifier  ?? Hot tea with honey and lemon for cough PRN  ?? Patient instructions given for suspected Covid 19 infection..  ?? To ER or call 911 if any increasing difficulty breathing, shortness of breath, inability to swallow, hives, rash, facial/tongue swelling or temp greater than 103 degrees.  ?? Follow up with PCP or Flu Clinic as needed if symptoms worsen or do not improve.            Frutoso SchatzPamela S Darlen Gledhill, APRN - CNP  12/11/18     This visit was provided as a focused evaluation during the COVID -19 pandemic/national emergency.  A comprehensive review of all previous patient history and testing was not conducted.  Pertinent findings were elicited during the visit.

## 2018-12-17 LAB — COVID-19 AMBULATORY: SARS-CoV-2, NAA: NOT DETECTED

## 2018-12-18 NOTE — Telephone Encounter (Signed)
LM notifying pt of negative Covid done on 7-15

## 2019-04-06 ENCOUNTER — Inpatient Hospital Stay
Admit: 2019-04-06 | Discharge: 2019-04-06 | Disposition: A | Discharge status: Home / Self Care | Payer: MEDICAID | Attending: Emergency Medicine

## 2019-04-06 DIAGNOSIS — J209 Acute bronchitis, unspecified: Secondary | ICD-10-CM

## 2019-04-06 MED ORDER — AMOXICILLIN 500 MG PO CAPS
500 MG | ORAL_CAPSULE | Freq: Three times a day (TID) | ORAL | 0 refills | Status: DC
Start: 2019-04-06 — End: 2019-04-12

## 2019-04-06 MED ORDER — PREDNISONE 20 MG PO TABS
20 MG | ORAL_TABLET | Freq: Every day | ORAL | 0 refills | Status: AC
Start: 2019-04-06 — End: 2019-04-11

## 2019-04-06 NOTE — ED Provider Notes (Addendum)
eMERGENCY dEPARTMENT eNCOUnter        [name]    Chief Complaint   Patient presents with   ??? Cough   ??? Facial Pain       HPI    Roger Ochoa is a 33 y.o. male who presents to ED from home.  By car.  With complaint of cough and congestion.  Onset was 2 days.  Intensity of symptoms moderate.  Location of symptoms chest and sinuses.  Patient has sinus congestion and sinus pressure.  Patient has productive cough.  It is a smoker    REVIEW OF SYSTEMS    All systems reviewed and positives are in the HPI      PAST MEDICAL HISTORY    History reviewed. No pertinent past medical history.    SURGICAL HISTORY    Past Surgical History:   Procedure Laterality Date   ??? HAND SURGERY      left thumb surgery   ??? SHOULDER SURGERY      left shoulder x 2       CURRENT MEDICATIONS    Current Outpatient Rx   Medication Sig Dispense Refill   ??? amoxicillin (AMOXIL) 500 MG capsule Take 1 capsule by mouth 3 times daily for 10 days 30 capsule 0   ??? predniSONE (DELTASONE) 20 MG tablet Take 2 tablets by mouth daily for 5 doses 10 tablet 0   ??? ibuprofen (ADVIL;MOTRIN) 800 MG tablet Take 800 mg by mouth every 6 hours as needed for Pain or Fever         ALLERGIES    Allergies   Allergen Reactions   ??? Dilaudid [Hydromorphone Hcl] Other (See Comments)     Pt states that when he was given dilaudid during surgery that he woke with defib patches on his chest and that the staff told him it eas d/t a reaction from the dilaudid       FAMILY HISTORY    History reviewed. No pertinent family history.    SOCIAL HISTORY    Social History     Socioeconomic History   ??? Marital status: Single     Spouse name: None   ??? Number of children: None   ??? Years of education: None   ??? Highest education level: None   Occupational History   ??? None   Social Needs   ??? Financial resource strain: None   ??? Food insecurity     Worry: None     Inability: None   ??? Transportation needs     Medical: None     Non-medical: None   Tobacco Use   ??? Smoking status: Current Every  Day Smoker     Packs/day: 0.50     Years: 2.00     Pack years: 1.00     Types: Cigarettes   ??? Smokeless tobacco: Never Used   Substance and Sexual Activity   ??? Alcohol use: Yes     Comment: had some drinks last night, but states that he only drinks on occasion   ??? Drug use: No   ??? Sexual activity: Yes     Partners: Female   Lifestyle   ??? Physical activity     Days per week: None     Minutes per session: None   ??? Stress: None   Relationships   ??? Social Product manager on phone: None     Gets together: None     Attends religious service: None  Active member of club or organization: None     Attends meetings of clubs or organizations: None     Relationship status: None   ??? Intimate partner violence     Fear of current or ex partner: None     Emotionally abused: None     Physically abused: None     Forced sexual activity: None   Other Topics Concern   ??? None   Social History Narrative   ??? None       PHYSICAL EXAM    VITAL SIGNS: BP 127/77    Pulse 87    Temp 97.9 ??F (36.6 ??C) (Oral)    Resp 18    Ht 6\' 1"  (1.854 m)    Wt 223 lb 6.4 oz (101.3 kg)    SpO2 92%    BMI 29.47 kg/m??   Constitutional:  Well developed, well nourished, no acute distress, non-toxic appearance   Eyes: PERRL, conjunctiva normal   HENT: Left perinasal swelling and tenderness.  Postnasal drainage.  Pharyngeal erythema without exudate.  Respiratory: Coarse breath sounds nonlabored respiration  Cardiovascular:  Normal rate, normal rhythm, no murmurs, no gallops, no rubs   Musculoskeletal:  No edema   Integument:  Well hydrated, no rash     RADIOLOGY/PROCEDURES    No orders to display         Summation      Patient Course: Smoking  cessation is  discussed with the patient.  Patient will be sent home on amoxicillin and prednisone.  Warning signs were discussed.  Return to ED if worse.    ED Medications administered this visit:  Medications - No data to display    New Prescriptions from this visit:    New Prescriptions    AMOXICILLIN (AMOXIL) 500  MG CAPSULE    Take 1 capsule by mouth 3 times daily for 10 days    PREDNISONE (DELTASONE) 20 MG TABLET    Take 2 tablets by mouth daily for 5 doses       Follow-up:  [facility]  [address] [place]  [phone]    As needed, If symptoms worsen        Final Impression:   1. Acute bronchitis, unspecified organism    2. Acute maxillary sinusitis, recurrence not specified               (Please note that portions of this note were completed with a voice recognition program.  Efforts were made to edit the dictations but occasionally words are mis-transcribed.)          [phone], MD  04/06/19 1045       13/08/20, MD  04/15/19 [phone]

## 2019-04-12 ENCOUNTER — Inpatient Hospital Stay
Admit: 2019-04-12 | Discharge: 2019-04-12 | Disposition: A | Discharge status: Home / Self Care | Payer: MEDICAID | Attending: Family Medicine

## 2019-04-12 ENCOUNTER — Emergency Department: Admit: 2019-04-12 | Payer: MEDICAID

## 2019-04-12 DIAGNOSIS — J020 Streptococcal pharyngitis: Secondary | ICD-10-CM

## 2019-04-12 LAB — CBC WITH AUTO DIFFERENTIAL
Absolute Eos #: 0 10*3/uL (ref 0.0–0.4)
Absolute Lymph #: 1.6 10*3/uL (ref 1.0–4.8)
Absolute Mono #: 0.9 10*3/uL (ref 0.0–1.0)
Basophils Absolute: 0 10*3/uL (ref 0.0–0.2)
Basophils: 0 % (ref 0–2)
Eosinophils %: 0 % (ref 0–5)
Hematocrit: 53.1 % — ABNORMAL HIGH (ref 41–53)
Hemoglobin: 18 g/dL — ABNORMAL HIGH (ref 13.5–17.5)
Lymphocytes: 9 % — ABNORMAL LOW (ref 13–44)
MCH: 31.6 pg (ref 26–34)
MCHC: 33.8 g/dL (ref 31–37)
MCV: 93.4 fL (ref 80–100)
Monocytes: 5 % (ref 5–9)
Platelets: 284 10*3/uL (ref 140–450)
RBC: 5.68 m/uL (ref 4.5–5.9)
RDW: 13.5 % (ref 12.1–15.2)
Seg Neutrophils: 86 % — ABNORMAL HIGH (ref 39–75)
Segs Absolute: 15.5 10*3/uL — ABNORMAL HIGH (ref 2.1–6.5)
WBC: 18.1 10*3/uL — ABNORMAL HIGH (ref 3.5–11.0)

## 2019-04-12 LAB — COVID-19: SARS-CoV-2, Rapid: NOT DETECTED

## 2019-04-12 LAB — COMPREHENSIVE METABOLIC PANEL
ALT: 17 U/L (ref 5–41)
AST: 15 U/L (ref <40)
Albumin: 4.8 g/dL (ref 3.5–5.2)
Alkaline Phosphatase: 127 U/L (ref 40–129)
Anion Gap: 8 mmol/L — ABNORMAL LOW (ref 9–17)
BUN: 9 mg/dL (ref 6–20)
Bun/Cre Ratio: 11 (ref 9–20)
CO2: 27 mmol/L (ref 20–31)
Calcium: 9.3 mg/dL (ref 8.6–10.4)
Chloride: 102 mmol/L (ref 98–107)
Creatinine: 0.82 mg/dL (ref 0.70–1.20)
GFR African American: >60 mL/min (ref >60)
GFR Non-African American: >60 mL/min (ref >60)
Glucose: 126 mg/dL — ABNORMAL HIGH (ref 70–99)
Potassium: 4.2 mmol/L (ref 3.7–5.3)
Sodium: 137 mmol/L (ref 135–144)
Total Bilirubin: 0.71 mg/dL (ref 0.30–1.20)
Total Protein: 7.5 g/dL (ref 6.4–8.3)

## 2019-04-12 LAB — LACTIC ACID: Lactic Acid: 1.8 mmol/L (ref 0.5–2.2)

## 2019-04-12 LAB — STREP SCREEN GROUP A THROAT: Direct Exam: POSITIVE — AB

## 2019-04-12 LAB — MONONUCLEOSIS SCREEN: Mononucleosis Screen: NEGATIVE

## 2019-04-12 MED ORDER — PROMETHAZINE-DM 6.25-15 MG/5ML PO SYRP
Freq: Four times a day (QID) | ORAL | 0 refills | Status: AC | PRN
Start: 2019-04-12 — End: 2019-04-19

## 2019-04-12 MED ORDER — CEPHALEXIN 500 MG PO CAPS
500 MG | Freq: Once | ORAL | Status: AC
Start: 2019-04-12 — End: 2019-04-12
  Administered 2019-04-12: 15:00:00 500 mg via ORAL

## 2019-04-12 MED ORDER — CEPHALEXIN 500 MG PO CAPS
500 MG | ORAL_CAPSULE | Freq: Four times a day (QID) | ORAL | 0 refills | Status: DC
Start: 2019-04-12 — End: 2023-04-23

## 2019-04-12 MED FILL — CEPHALEXIN 500 MG PO CAPS: 500 mg | ORAL | Qty: 1

## 2019-04-12 NOTE — ED Provider Notes (Signed)
eMERGENCY dEPARTMENT eNCOUnter        CHIEF COMPLAINT    Chief Complaint   Patient presents with   ??? Pharyngitis     was seen 1 week ago for bronchitis. states he has sore in his throat and cannot swallow. complians of bilateral ear pain    ??? Otalgia       HPI    Roger Ochoa is a 33 y.o. male who presents with a sore throat for 1 week.  Patient was seen in the ER 1 week ago and diagnosed with acute bronchitis.  He has been on amoxicillin.  He was also treated with prednisone.  Patient also complains of bilateral ear pain with a sore throat.  He is feeling fatigued.  He has had a nonproductive cough during this time also.  Patient has not had a fever.    REVIEW OF SYSTEMS    All systems reviewed and positives are in the HPI.    PAST MEDICAL HISTORY    No past medical history on file.    SURGICAL HISTORY    Past Surgical History:   Procedure Laterality Date   ??? HAND SURGERY      left thumb surgery   ??? SHOULDER SURGERY      left shoulder x 2       CURRENT MEDICATIONS    Current Outpatient Rx   Medication Sig Dispense Refill   ??? cephALEXin (KEFLEX) 500 MG capsule Take 1 capsule by mouth 4 times daily 40 capsule 0   ??? promethazine-dextromethorphan (PROMETHAZINE-DM) 6.25-15 MG/5ML syrup Take 5 mLs by mouth 4 times daily as needed for Cough 60 mL 0   ??? ibuprofen (ADVIL;MOTRIN) 800 MG tablet Take 800 mg by mouth every 6 hours as needed for Pain or Fever         ALLERGIES    Allergies   Allergen Reactions   ??? Dilaudid [Hydromorphone Hcl] Other (See Comments)     Pt states that when he was given dilaudid during surgery that he woke with defib patches on his chest and that the staff told him it eas d/t a reaction from the dilaudid       FAMILY HISTORY    No family history on file.    SOCIAL HISTORY    Social History     Socioeconomic History   ??? Marital status: Single     Spouse name: Not on file   ??? Number of children: Not on file   ??? Years of education: Not on file   ??? Highest education level: Not on file    Occupational History   ??? Not on file   Social Needs   ??? Financial resource strain: Not on file   ??? Food insecurity     Worry: Not on file     Inability: Not on file   ??? Transportation needs     Medical: Not on file     Non-medical: Not on file   Tobacco Use   ??? Smoking status: Current Every Day Smoker     Packs/day: 0.50     Years: 2.00     Pack years: 1.00     Types: Cigarettes   ??? Smokeless tobacco: Never Used   Substance and Sexual Activity   ??? Alcohol use: Yes     Comment: had some drinks last night, but states that he only drinks on occasion   ??? Drug use: No   ??? Sexual activity: Yes  Partners: Female   Lifestyle   ??? Physical activity     Days per week: Not on file     Minutes per session: Not on file   ??? Stress: Not on file   Relationships   ??? Social Product manager on phone: Not on file     Gets together: Not on file     Attends religious service: Not on file     Active member of club or organization: Not on file     Attends meetings of clubs or organizations: Not on file     Relationship status: Not on file   ??? Intimate partner violence     Fear of current or ex partner: Not on file     Emotionally abused: Not on file     Physically abused: Not on file     Forced sexual activity: Not on file   Other Topics Concern   ??? Not on file   Social History Narrative   ??? Not on file       PHYSICAL EXAM    VITAL SIGNS: BP 113/68    Pulse 85    Temp 98.7 ??F (37.1 ??C) (Oral)    Resp 18    Wt 223 lb 3.2 oz (101.2 kg)    SpO2 95%    BMI 29.45 kg/m??    Constitutional:  Well developed, well nourished,moderate acute distress   HENT:  Atraumatic, external ears normal, TMs are intact without erythema, nose normal, oropharynx moist throat is erythematous with mild tonsillar enlargement bilaterally. Neck- supple with bilateral anterior cervical lymphadenopathy.  Respiratory: Lungs clear  Cardiovascular: Regular rate and rhythm without murmurs or gallops  GI:  Soft, nondistended, normal bowel sounds, nontender, no  organomegaly, no mass, no rebound, no guarding   Musculoskeletal:  No edema, no tenderness, no deformities. Back- no tenderness   Integument:  Well hydrated, no rash   Neurologic:  Alert & oriented x 3, no focal deficits noted     EKG        RADIOLOGY/PROCEDURES    Chest x-ray was normal.    ED COURSE & MEDICAL DECISION MAKING    Pertinent Labs & Imaging studies reviewed. (See chart for details)  Labs Reviewed   STREP SCREEN GROUP A THROAT - Abnormal; Notable for the following components:       Result Value    Direct Exam POSITIVE for Group A Streptococci (*)     All other components within normal limits   CBC WITH AUTO DIFFERENTIAL - Abnormal; Notable for the following components:    WBC 18.1 (*)     Hemoglobin 18.0 (*)     Hematocrit 53.1 (*)     Seg Neutrophils 86 (*)     Lymphocytes 9 (*)     Segs Absolute 15.50 (*)     All other components within normal limits   COMPREHENSIVE METABOLIC PANEL - Abnormal; Notable for the following components:    Glucose 126 (*)     Anion Gap 8 (*)     All other components within normal limits   CULTURE, BLOOD 1   COVID-19   LACTIC ACID, PLASMA   MONONUCLEOSIS SCREEN     Rapid strep was positive.  Covid testing was negative.  Treat with Keflex.  Patient also requested a prescription for Phenergan DM for cough.  Patient was given a prescription for this.  He was stable on discharge.    FINAL IMPRESSION    1.  Strep pharyngitis  2.      Summation      Patient Course: Patient was stable on discharge.  Covid testing was negative.  Rapid strep was positive chest x-ray was normal.    ED Medications administered this visit:    Medications   cephALEXin (KEFLEX) capsule 500 mg (500 mg Oral Given 04/12/19 0956)       New Prescriptions from this visit:    New Prescriptions    CEPHALEXIN (KEFLEX) 500 MG CAPSULE    Take 1 capsule by mouth 4 times daily    PROMETHAZINE-DEXTROMETHORPHAN (PROMETHAZINE-DM) 6.25-15 MG/5ML SYRUP    Take 5 mLs by mouth 4 times daily as needed for Cough        Follow-up:  Your physician    Call in 2 days          Final Impression:   1. Strep pharyngitis               (Please note that portions of this note were completed with a voice recognition program.  Efforts were made to edit the dictations but occasionally words are mis-transcribed.)     Jaclyn Prime, MD  04/12/19 1017

## 2019-04-18 LAB — CULTURE, BLOOD 1
Culture: NO GROWTH
Special Requests: 10

## 2023-04-23 ENCOUNTER — Emergency Department: Admit: 2023-04-23 | Payer: MEDICAID

## 2023-04-23 ENCOUNTER — Inpatient Hospital Stay
Admission: EM | Admit: 2023-04-23 | Discharge: 2023-04-23 | Disposition: A | Discharge status: Home / Self Care | Payer: MEDICAID | Admitting: Emergency Medicine

## 2023-04-23 DIAGNOSIS — U071 COVID-19: Secondary | ICD-10-CM

## 2023-04-23 LAB — RAPID INFLUENZA A/B ANTIGENS
Flu A Antigen: NEGATIVE
Flu B Antigen: NEGATIVE

## 2023-04-23 LAB — COVID-19, RAPID: SARS-CoV-2, Rapid: DETECTED — AB

## 2023-04-23 MED ORDER — PSEUDOEPHEDRINE-DM-GG 60-15-400 MG PO TABS
60-15-400 | ORAL_TABLET | ORAL | 0 refills | Status: AC | PRN
Start: 2023-04-23 — End: ?

## 2023-04-23 MED ORDER — ONDANSETRON 4 MG PO TBDP
4 | ORAL_TABLET | Freq: Four times a day (QID) | ORAL | 0 refills | Status: AC | PRN
Start: 2023-04-23 — End: ?

## 2023-04-23 MED ORDER — ONDANSETRON 4 MG PO TBDP
4 | Freq: Once | ORAL | Status: AC
Start: 2023-04-23 — End: 2023-04-23
  Administered 2023-04-23: 17:00:00 4 mg via ORAL

## 2023-04-23 MED FILL — ONDANSETRON 4 MG PO TBDP: 4 mg | ORAL | Qty: 1

## 2023-04-23 NOTE — ED Provider Notes (Signed)
 [facility]  EMERGENCY DEPARTMENT ENCOUNTER      Pt Name: Roger Ochoa  MRN: [id number]  Birthdate Oct 10, 1985  Date of evaluation: 04/23/2023  Provider: Farrell Ours, MD    CHIEF COMPLAINT       Chief Complaint   Patient presents with    [facility]

## 2023-04-23 NOTE — ED Notes (Signed)
 Ticket to ride completed. The following information was reported off:  Name  Allergies  Orientation Level  Destination  Safety Issues  Code Status  Oxygen Requirements  Special needs including mobility, language, communication        Pt to CT
